# Patient Record
Sex: Female | Born: 1970 | Race: White | Hispanic: No | Marital: Married | Smoking: Never smoker
Health system: Southern US, Community
[De-identification: ages and names within clinical notes are randomized; demographics above are authoritative.]

## PROBLEM LIST (undated history)

## (undated) DIAGNOSIS — F32A Depression, unspecified: Secondary | ICD-10-CM

## (undated) DIAGNOSIS — M722 Plantar fascial fibromatosis: Secondary | ICD-10-CM

## (undated) DIAGNOSIS — Z86711 Personal history of pulmonary embolism: Secondary | ICD-10-CM

## (undated) DIAGNOSIS — G4730 Sleep apnea, unspecified: Secondary | ICD-10-CM

## (undated) DIAGNOSIS — I10 Essential (primary) hypertension: Secondary | ICD-10-CM

## (undated) HISTORY — DX: Personal history of pulmonary embolism: Z86.711

## (undated) HISTORY — DX: Essential (primary) hypertension: I10

## (undated) HISTORY — DX: Depression, unspecified: F32.A

## (undated) HISTORY — PX: CHOLECYSTECTOMY: SHX55

## (undated) HISTORY — DX: Plantar fascial fibromatosis: M72.2

---

## 1998-08-12 ENCOUNTER — Ambulatory Visit (HOSPITAL_COMMUNITY): Admission: RE | Admit: 1998-08-12 | Discharge: 1998-08-12 | Payer: Self-pay | Admitting: Internal Medicine

## 1998-08-12 ENCOUNTER — Encounter: Payer: Self-pay | Admitting: Internal Medicine

## 1998-08-13 ENCOUNTER — Ambulatory Visit (HOSPITAL_COMMUNITY): Admission: RE | Admit: 1998-08-13 | Discharge: 1998-08-13 | Payer: Self-pay | Admitting: Internal Medicine

## 1999-09-14 ENCOUNTER — Ambulatory Visit (HOSPITAL_COMMUNITY): Admission: RE | Admit: 1999-09-14 | Discharge: 1999-09-14 | Payer: Self-pay | Admitting: Obstetrics and Gynecology

## 1999-09-14 ENCOUNTER — Encounter: Payer: Self-pay | Admitting: Obstetrics and Gynecology

## 1999-11-22 ENCOUNTER — Ambulatory Visit (HOSPITAL_COMMUNITY): Admission: RE | Admit: 1999-11-22 | Discharge: 1999-11-22 | Payer: Self-pay | Admitting: Obstetrics and Gynecology

## 1999-12-30 ENCOUNTER — Inpatient Hospital Stay (HOSPITAL_COMMUNITY): Admission: AD | Admit: 1999-12-30 | Discharge: 1999-12-30 | Payer: Self-pay | Admitting: Obstetrics and Gynecology

## 2000-01-19 ENCOUNTER — Encounter (HOSPITAL_COMMUNITY): Admission: AD | Admit: 2000-01-19 | Discharge: 2000-02-01 | Payer: Self-pay | Admitting: Obstetrics and Gynecology

## 2000-01-31 ENCOUNTER — Inpatient Hospital Stay (HOSPITAL_COMMUNITY): Admission: AD | Admit: 2000-01-31 | Discharge: 2000-02-02 | Payer: Self-pay | Admitting: Obstetrics and Gynecology

## 2000-01-31 ENCOUNTER — Encounter (INDEPENDENT_AMBULATORY_CARE_PROVIDER_SITE_OTHER): Payer: Self-pay

## 2000-02-03 ENCOUNTER — Encounter: Admission: RE | Admit: 2000-02-03 | Discharge: 2000-04-12 | Payer: Self-pay | Admitting: Obstetrics and Gynecology

## 2000-03-20 ENCOUNTER — Other Ambulatory Visit: Admission: RE | Admit: 2000-03-20 | Discharge: 2000-03-20 | Payer: Self-pay | Admitting: Obstetrics and Gynecology

## 2000-11-23 ENCOUNTER — Ambulatory Visit (HOSPITAL_COMMUNITY): Admission: RE | Admit: 2000-11-23 | Discharge: 2000-11-23 | Payer: Self-pay | Admitting: Internal Medicine

## 2000-11-23 ENCOUNTER — Encounter: Payer: Self-pay | Admitting: Internal Medicine

## 2000-12-24 ENCOUNTER — Observation Stay (HOSPITAL_COMMUNITY): Admission: RE | Admit: 2000-12-24 | Discharge: 2000-12-25 | Payer: Self-pay | Admitting: General Surgery

## 2000-12-24 ENCOUNTER — Encounter: Payer: Self-pay | Admitting: General Surgery

## 2000-12-24 ENCOUNTER — Encounter (INDEPENDENT_AMBULATORY_CARE_PROVIDER_SITE_OTHER): Payer: Self-pay

## 2001-04-01 ENCOUNTER — Other Ambulatory Visit: Admission: RE | Admit: 2001-04-01 | Discharge: 2001-04-01 | Payer: Self-pay | Admitting: Obstetrics and Gynecology

## 2005-06-16 ENCOUNTER — Inpatient Hospital Stay (HOSPITAL_COMMUNITY): Admission: AD | Admit: 2005-06-16 | Discharge: 2005-06-16 | Payer: Self-pay | Admitting: Obstetrics and Gynecology

## 2005-08-18 ENCOUNTER — Inpatient Hospital Stay (HOSPITAL_COMMUNITY): Admission: AD | Admit: 2005-08-18 | Discharge: 2005-08-22 | Payer: Self-pay | Admitting: Obstetrics and Gynecology

## 2005-08-18 ENCOUNTER — Encounter (INDEPENDENT_AMBULATORY_CARE_PROVIDER_SITE_OTHER): Payer: Self-pay | Admitting: Specialist

## 2005-08-21 ENCOUNTER — Encounter: Payer: Self-pay | Admitting: Urology

## 2005-08-25 ENCOUNTER — Ambulatory Visit (HOSPITAL_COMMUNITY): Admission: RE | Admit: 2005-08-25 | Discharge: 2005-08-25 | Payer: Self-pay | Admitting: Urology

## 2005-08-25 ENCOUNTER — Ambulatory Visit (HOSPITAL_COMMUNITY): Admission: RE | Admit: 2005-08-25 | Discharge: 2005-08-25 | Payer: Self-pay | Admitting: Diagnostic Radiology

## 2005-09-04 ENCOUNTER — Ambulatory Visit (HOSPITAL_COMMUNITY): Admission: RE | Admit: 2005-09-04 | Discharge: 2005-09-04 | Payer: Self-pay | Admitting: Urology

## 2005-09-06 ENCOUNTER — Ambulatory Visit (HOSPITAL_COMMUNITY): Admission: RE | Admit: 2005-09-06 | Discharge: 2005-09-06 | Payer: Self-pay | Admitting: Urology

## 2007-10-07 ENCOUNTER — Other Ambulatory Visit: Admission: RE | Admit: 2007-10-07 | Discharge: 2007-10-07 | Payer: Self-pay | Admitting: Obstetrics and Gynecology

## 2008-05-26 ENCOUNTER — Ambulatory Visit: Payer: Self-pay | Admitting: Cardiology

## 2008-05-27 ENCOUNTER — Inpatient Hospital Stay (HOSPITAL_COMMUNITY): Admission: EM | Admit: 2008-05-27 | Discharge: 2008-06-01 | Payer: Self-pay | Admitting: Emergency Medicine

## 2008-05-27 ENCOUNTER — Ambulatory Visit: Payer: Self-pay | Admitting: Vascular Surgery

## 2008-05-27 ENCOUNTER — Encounter (INDEPENDENT_AMBULATORY_CARE_PROVIDER_SITE_OTHER): Payer: Self-pay | Admitting: Pulmonary Disease

## 2008-10-13 ENCOUNTER — Other Ambulatory Visit: Admission: RE | Admit: 2008-10-13 | Discharge: 2008-10-13 | Payer: Self-pay | Admitting: Obstetrics and Gynecology

## 2009-10-14 ENCOUNTER — Other Ambulatory Visit: Admission: RE | Admit: 2009-10-14 | Discharge: 2009-10-14 | Payer: Self-pay | Admitting: Obstetrics and Gynecology

## 2010-02-11 IMAGING — CR DG CHEST 2V
2 series · 2 of 2 positions shown · non-contrast
Comparison: None available

CLINICAL DATA: Shortness of breath.

CHEST - 2 VIEW

[w chest pa]
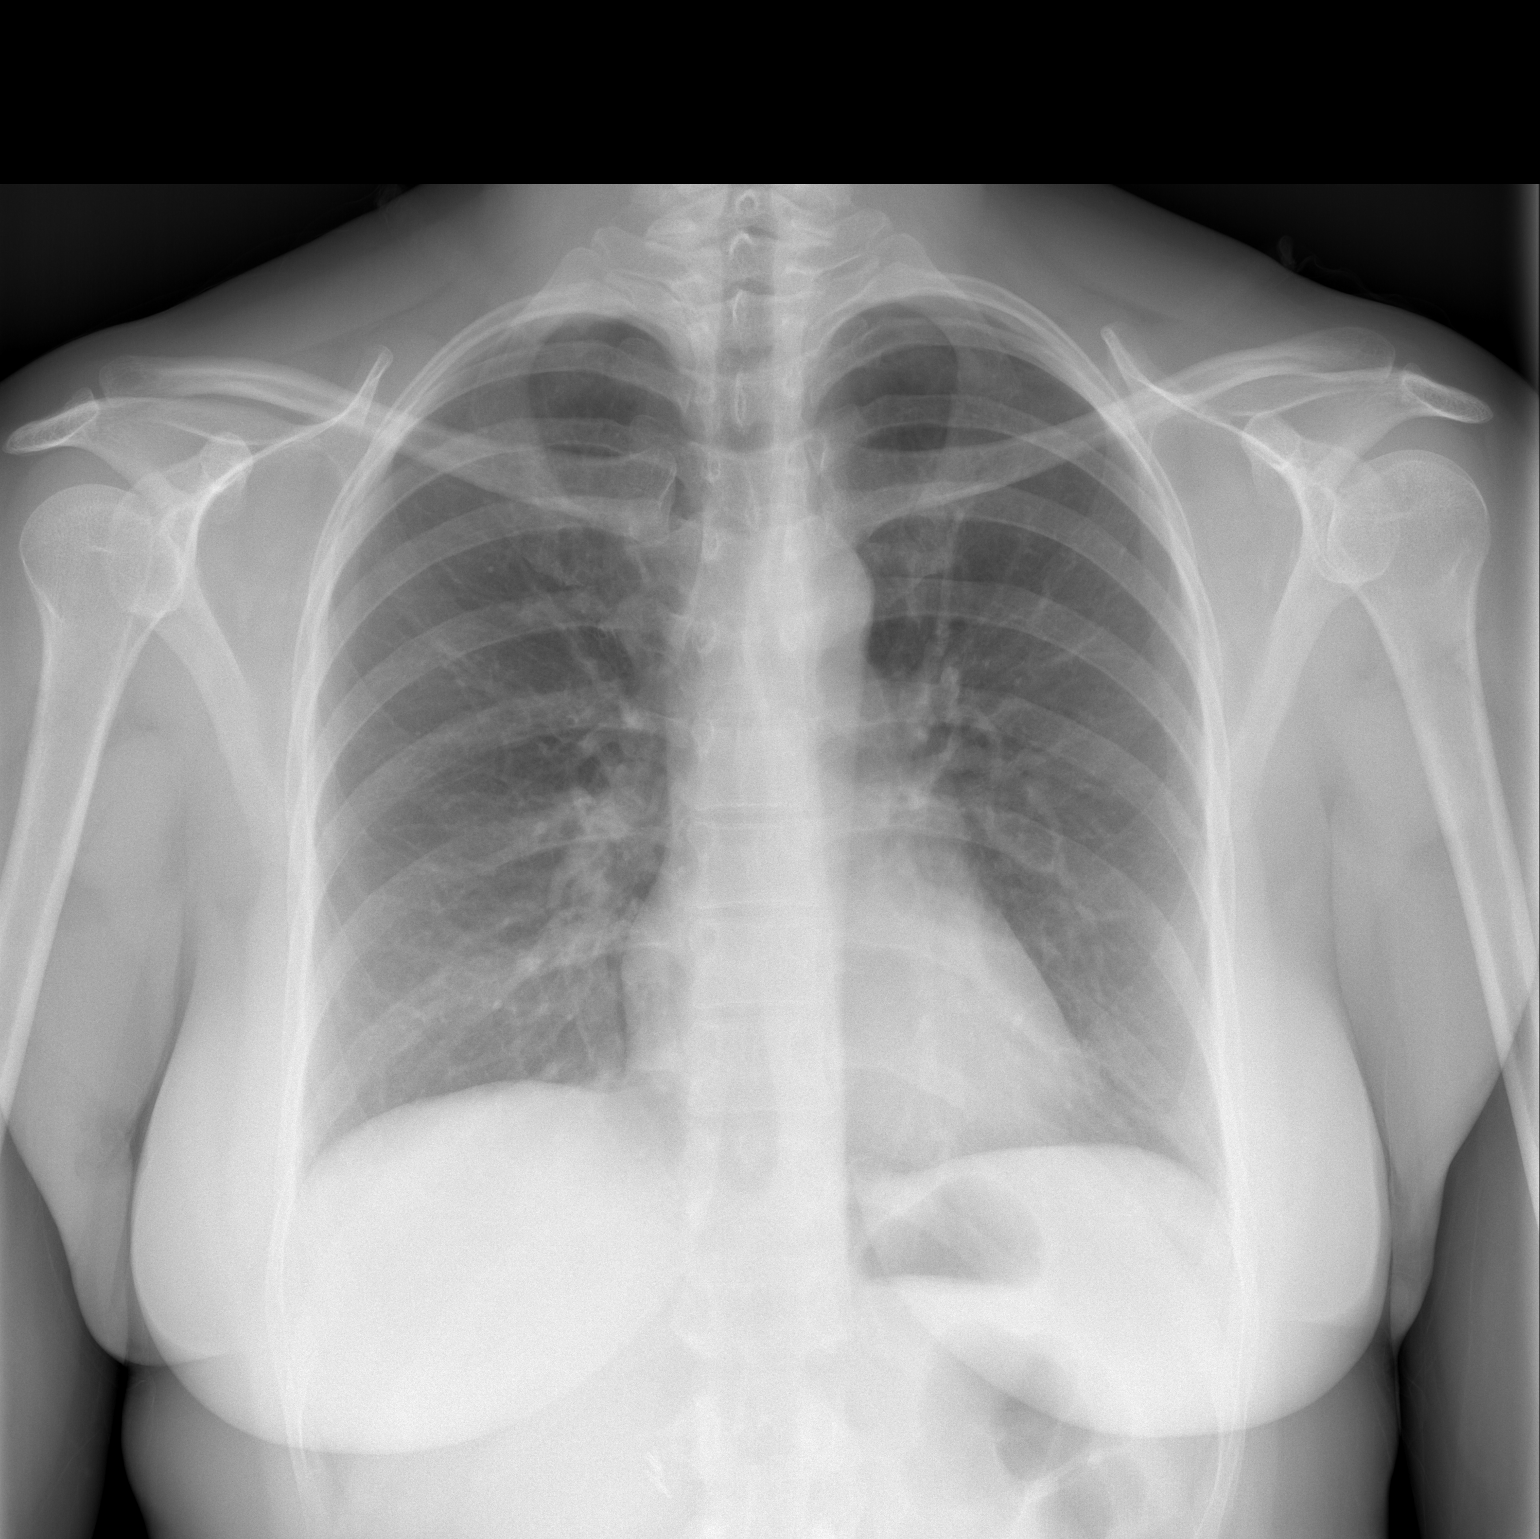

[w chest lat]
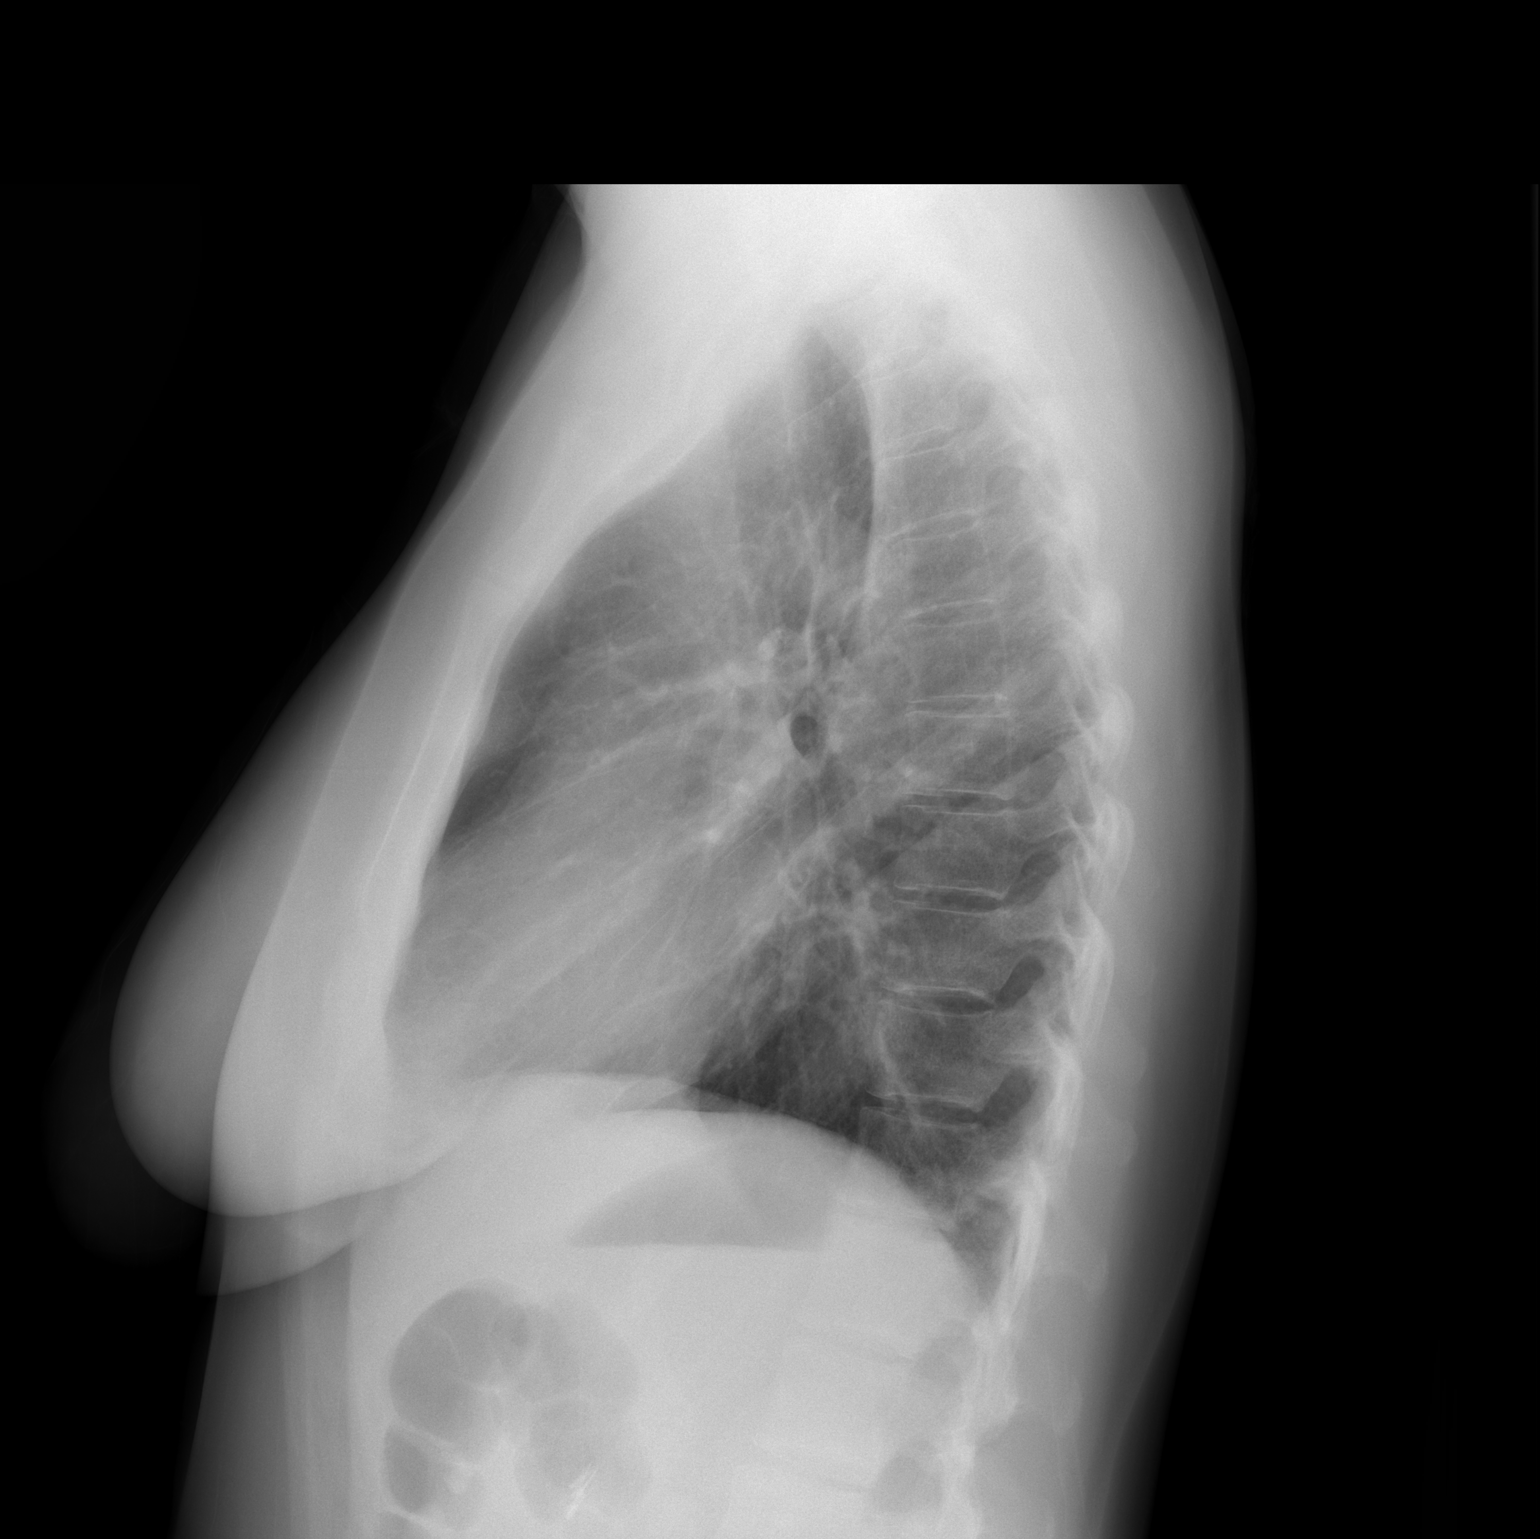

[2 of 2 positions shown; findings below may reference images not displayed]

FINDINGS: The lungs are clear without focal consolidation, edema,
effusion or pneumothorax.  Cardiopericardial silhouette is within
normal limits for size.  Imaged bony structures of the thorax are
intact.
IMPRESSION: No acute cardiopulmonary process.

## 2010-11-05 ENCOUNTER — Encounter: Payer: Self-pay | Admitting: Urology

## 2011-02-28 NOTE — H&P (Signed)
NAME:  [name], [name] NO.:  [id number]   MEDICAL RECORD NO.:  [id number]          PATIENT TYPE:  INP   LOCATION:  0101                         FACILITY:  [facility]   PHYSICIAN:  Vanetta Mulders, MD         DATE OF BIRTH:  April 21, 1971   DATE OF ADMISSION:  05/27/2008  DATE OF DISCHARGE:                              HISTORY & PHYSICAL   PRIMARY CARE PHYSICIAN:  Dr. Reuel Boom with [facility] in  [place], [place].   The patient is going to be admitted to [facility], team H.   CHIEF COMPLAINT:  Right lower lobe segmental PE.   PRESENT ILLNESS:  Ms. [name] is a 40 year old woman with an  insignificant past medical history, who presented to the emergency room  for right lower pleuritic chest pain associated with shortness of  breath, tachycardia, right shoulder pain, right arm numbness and right  neck pain.  She started having these symptoms on the day prior to  admission around 2 p.m.  At that time, she noted slight discomfort in  her ribcage on the right lower side that got worse through the day to  the point where she could not sit still, increased from a level 2 to a  level 9, and made her come to the emergency room.  On her way to the  emergency room, she started having shortness of breath, heart racing,  right shoulder pain, right arm numbness and right neck pain.  The  patient denies having any blood clotting disorders.  She has never had a  blood clot.  She denies history of miscarriages.  She has three healthy  daughters.  Recently, she had traveled to [place] by car, she drove  for 5 hours back and forth, that was 3-4 days ago.  Three weeks ago, she  traveled to [place].  They drove there.  At that time though she stopped  frequently.   PAST MEDICAL HISTORY:  1. History of C-section.  2. History of cholecystectomy in 2002.   FAMILY HISTORY:  The patient's mother has hypertension and thyroid  problems.  The patient's father has history of tonsil  cancer, but both  of them are alive and healthy at this time.   SOCIAL HISTORY:  The patient lives with her husband.  She has three  children, all healthy.  Her younger daughter is 38-1/2 years old.  The  patient denies smoking, drinking or taking any drugs.   DRUG ALLERGIES:  NO KNOWN DRUG ALLERGY.   CURRENT MEDICATIONS:  1. Lexapro.  2. Yaz.  3. Trazodone.   She states that several months ago, she was on herbal supplements for a  certain diet that she was doing.  The nutritionist that she was seeing  at that time gave her subcutaneous injections in the abdominal area.  She does not know what exactly those injections were containing.   REVIEW OF SYSTEMS:  Negative for pertinent other than history and  physical.  In particular, the patient denies any problems with  urination, burning micturition or frequent urination.   PHYSICAL EXAMINATION:  VITAL SIGNS:  Shows a temperature of 97, heart  rate 98-102, respiratory rate 18-20, blood pressure 108/72-117/79.  Oxygen saturation 97-100.  GENERAL:  The patient is in no acute distress.  HEENT:  Eyes PERRLA.  Extraocular movements intact.  Normal lucency of  tympanic membranes.  Clear ear canals.  Nose, moist mucosa.  NECK:  Supple.  CHEST:  Good air movement.  Clear to auscultation bilaterally.  No  wheezing, no crackles, no dullness to percussion.  ABDOMEN:  Bowel sounds positive.  Soft, nontender, nondistended.  EXTREMITIES:  No edema, good pulses.  Homan's negative.  HEART:  Tachycardia, regular.  NEUROLOGIC:  Nonfocal.  PSYCHOLOGIC:  Alert and oriented.  SKIN:  No rash.   LABORATORY DATA:  PTT 28, PT 12.1, INR 0.9, CK-MB less than 1, troponin  0.05.  Myoglobin 45.5, D dimer 0.75.  Urine micro showing 7-10 red blood  cells.  Urine pregnancy test negative.  CT pelvis - tiny bilateral  nonobstructive calculi, no pyelo, no hydronephrosis.  CT angio of the  chest showed acute pulmonary embolus segmental and subsegmental right   lower lobe.   IMPRESSION:  Right lower lobe segment and subsegmental pulmonary  embolism.  Electrocardiogram showed a small S1 and a small Q3.  An  incomplete right bundle branch block, which is consistent with some  right ventricular strain, most likely from the pulmonary embolism.  At  this time, we will admit the patient to stepdown.  The patient was  started on heparin IV before I saw her in the emergency department.  Will continue this with caveat that  she will need to be switched from  heparin to Lovenox for home anticoagulation.  We will start Coumadin for  pulmonary embolism treatment.  We will check regular PT/INRs.  The  patient received oxygen, and she will continue oxygen therapy at 2  liters per minute.  She is going to be on bed rest for now.  We are  going to check lower extremity Dopplers and a 2-D echo, as well as a  brain natriuretic peptide and continue cycling enzymes for pulmonary  embolism prognosis and restratification.      Vanetta Mulders, MD  Electronically Signed     DA/MEDQ  D:  05/27/2008  T:  05/27/2008  Job:  [id number]

## 2011-02-28 NOTE — Discharge Summary (Signed)
NAMEMarland Kitchen  [name], [name] NO.:  [id number]   MEDICAL RECORD NO.:  [id number]          PATIENT TYPE:  INP   LOCATION:  1424                         FACILITY:  [facility]   PHYSICIAN:  Isidor Holts, M.D.  DATE OF BIRTH:  08-Sep-1971   DATE OF ADMISSION:  05/26/2008  DATE OF DISCHARGE:  06/01/2008                               DISCHARGE SUMMARY   PMD:  Dr. Donzetta Sprung, [facility] in [place], [place]   DISCHARGE DIAGNOSES:  1. Acute pulmonary embolism.  2. History of oral contraceptive use.  3. Transaminitis.  4. History of previous cholecystectomy.  5. History of previous cesarean section.   DISCHARGE MEDICATIONS:  1. Lovenox 120 mg subcutaneously daily until INR therapeutic at 2 to 3      for 48 hours, then stop.  2. Coumadin q. 6 p.m. daily per INR.  3. Darvocet-N 100, one p.o. p.r.n. q.6 hourly.  4. Lexapro 10 mg p.o. daily.  5. Trazodone 50 mg p.o. p.r.n. nightly.   Note:  Dianah Field has been discontinued.   This medication list may, of course, be updated/modified at the time of  actual discharge, by discharging M.D.   PROCEDURES:  1. Chest x-ray May 26, 2008.  This showed no acute cardiopulmonary      process.  2. Abdominal/pelvic CT scan dated May 26, 2008.  This showed trace      atelectasis or infection in the posterior right lower lobe deep in      the costophrenic sulcus, tiny bilateral nonobstructive renal      calculi with no secondary changes in either kidney.  CT pelvis      showed no distal urethral or bladder calculi.  3. Chest CT angiogram dated May 27, 2008.  This showed acute      pulmonary embolism to segmental and subsegmental pulmonary arteries      to the right lobe.  4. Bilateral lower extremity venous Doppler dated May 27, 2008.      This showed no evidence of DVT or superficial thrombosis.   CONSULTATIONS:  None.   ADMISSION HISTORY:  As in H&P notes of May 27, 2008 dictated by Dr.  Vanetta Mulders.   However, in brief, this is a 40 year old female, with known  history of previous cesarean section status post cholecystectomy 2002,  currently on oral contraceptive medication, who presented with a 1-day  history of right lower pleuritic-type chest pain, associated with  shortness of breath and tachycardia.  Reportedly, 3 weeks ago, she had  traveled by car to [place] and 3 to 4 days prior to presentation, she  had traveled to [place] by car, ie 5 hour trip to and fro.  She was  admitted for further evaluation, investigation and management.   CLINICAL COURSE:  1. Pulmonary embolism.  For details of presentation, refer to      admission history above.  The patient underwent chest CT angiogram      which revealed acute pulmonary embolism.  For details of findings,      refer to procedure list above.  The patient was managed  with      anticoagulation initially with intravenous Heparin, and      subsequently transitioned to subcutaneous Lovenox and concomitant      Coumadin treatment.  She did have severe right-sided pleuritic type      chest pain, likely against background of possible pulmonary      infarct.  She was therefore, managed with analgesic medication, ie      a brief course of NSAIDS as well as opioid analgesics, with      satisfactory clinical response.  By May 30, 2008, chest pain was      considerably ameliorated.  The patient was no longer short of      breath at rest.  She was able to ambulate and we were able to      transfer her from the step-down unit to the telemetry floor.  By      May 31, 2008, the patient was completely asymptomatic.  Clearly,      predispositions for the patient's pulmonary embolism was her oral      contraceptive use which has been discontinued, as well as recent      long distance travel by road.  As these conditions are reversible      and easily identifiable, the patient is recommended a maximum of 6      months of anticoagulation  therapy.  Clearly she will need to      utilize some other form of contraception, i.e. non-hormonal.   1. Transaminitis.  LFTs at the time of initial presentation showed an      alkaline phosphatase of 56, AST 24, ALT 22.  However, on May 28, 2008, LFTs had become altered with alkaline phosphatase of 94, AST      125, ALT 161, raising the possibility of acute hepatitis versus      hepatic irritation from right supradiaphragmatic process.      Hepatitis A, B and C screens were done.  These were negative.  By      May 30, 2008, LFTs had practically normalized and alkaline      phosphatase was 79, AST 90, ALT 61.  The patient has been reassured      accordingly.   DISPOSITION:  The patient was on May 31, 2008, considered  sufficiently clinically recovered for discharge to be considered on  June 01, 2008, provided no acute problems arise in the interim.  She  has been recommended to return to regular duties after 2 weeks, or  otherwise as indicated by her primary M.D.   DIET:  No restrictions.   ACTIVITY:  As tolerated.   FOLLOWUP INSTRUCTIONS:  The patient is to follow up with her primary  M.D., Dr. Donzetta Sprung at [facility] in [place], [place] within 1 week of discharge.   SPECIAL INSTRUCTIONS:  The patient is recommended to have PT/INR checked  in a.m. of June 03, 2008 at PMD's office.  PMD will subsequently  contact the patient with recommendations as to Coumadin dosage.  The  patient's PMD is expected to supervise subsequent anticoagulation.  Fortunately, the patient's spouse is an [employer] fundraiser. and can administer Lovenox  until recommended otherwise, by the patient's primary M.D.  All this has  been communicated to the patient and and spouse, and they have  verbalized understanding.      Isidor Holts, M.D.  Electronically Signed    CO/MEDQ  D:  05/31/2008  T:  05/31/2008  Job:  [id number]   cc:   Donzetta Sprung  Fax: [phone]

## 2011-03-03 NOTE — Discharge Summary (Signed)
[name], [name]                ACCOUNT NO.:  [id number]   MEDICAL RECORD NO.:  [id number]          PATIENT TYPE:  INP   LOCATION:  9109                          FACILITY:  [facility]   PHYSICIAN:  Malachi Pro. Ambrose Mantle, M.D. DATE OF BIRTH:  1970-11-08   DATE OF ADMISSION:  08/18/2005  DATE OF DISCHARGE:  08/22/2005                                 DISCHARGE SUMMARY   This is a 40 year old white female para 2-0-0-2, gravida 3, EDC September 04, 2005 who was scheduled for a cesarean section because she did not want to  undergo the trauma of vaginal delivery again.  She came to the hospital at 6  cm dilatation and declined to proceed with vaginal delivery.  I was called  and the patient was prepared for cesarean section and when I arrived in the  operating room the patient already had her spinal and was on the operating  table with the abdomen prepped.  I was told after the surgery that the  patient was pushing in the operating room.  She underwent a low transverse  cervical cesarean section and because the baby's head was so low I actually  entered the uterus around the area of the scapula.  I was able to lift the  baby out of the deep position, through the incisional opening.  It was a 6  pound 12 ounce female with Apgars of 9 at one and 9 at five minutes.  I  identified the extent of the uterine incision, but realized I had extremely  severe hemorrhage lateral to the left uterine angle.  I packed this with  several packs, went ahead and closed the uterine incision, and waited for  Dr. Jackelyn Knife to arrive before trying to control the bleeding lateral to the  left uterine incision.  After his arrival I proceeded to obtain hemostasis  by ligating several blood vessels and the suturing was done well inferior  and lateral to the left uterine angle.  Because of the location of some of  the suturing, I wanted to do a CT scan on the first postoperative day.  The  patient did not have any symptoms of  pain but the CT scan did show a high-  grade left ureteral obstruction.  The earliest the interventional radiology  department could schedule the percutaneous nephrostomy was on August 21, 2005.  The patient did undergo a successful left nephrostomy by Dr. Chandra Batch  on August 21, 2005 but there was complete obstruction of the distal ureter.  He could not pass a wire into the bladder.  Dr. Isabel Caprice had seen the patient  in consultation on August 19, 2005 and had requested the interventional  radiology department to proceed with nephrostomy when they could.  From the  patient's cesarean section standpoint she progressed well with passing  flatus, tolerating a regular diet, ambulating well, voiding well, and on the  fourth postoperative day was ready for discharge.  Dr. Chandra Batch had suggested  that I keep her in the hospital overnight to ensure that she had no  significant problems.  She did have  hematuria through the nephrostomy but  this did not involve clots and did not involve clogging of the catheter.  Dr. Isabel Caprice evaluated the patient after the nephrostomy and he agreed with  Dr. Chandra Batch to repeat the nephrostogram on Friday and if they were unable to  pass it the next attempt would be in approximately two weeks.  Laboratory  data showed that the patient did get RhoGAM on August 19, 2005.  The  patient's laboratory work for some reason is not in her chart but she did  start with a normal hemoglobin and postoperatively her blood count went as  low as hemoglobin of 8.7, hematocrit 25.6, white count 13,400.  A CT scan on  August 19, 2005 showed a high-grade left urinary tract obstruction  worrisome for ureteral injury.  There were bilateral pleural effusions and  bibasilar atelectasis.  Patient had a small hiatal hernia.  Liver, adrenal  gland, spleen, and pancreas all appear normal.  Patient was status post  cholecystectomy.  There was bilateral hydronephrosis, tiny, non-obstructing   calculi was seen in the upper pole of the kidneys bilaterally.  There was  marked delay of excretion of the contrast material on the left.  Findings  were compatible with left ureteral high-grade obstruction.  The bowel  appeared normal.  The patient prefers not to have her staples removed on the  day of discharge because she might have to lie on her abdomen and she will  come back to the office in one week to have those removed.  She is to return  for a nephrostogram on August 25, 2005.  She understands how that will be  done.  She is given a prescription for Percocet 5/325, 30 tablets one or two  every four to six hours as needed for pain and Darvocet-N 100 30 tablets one  every four to six hours as needed for pain, but do not use them both at the  same time.  She is also given a prescription for Keflex 500 mg 28 tablets  one every six hours as needed for pain.  I will check this dosage with Dr.  Isabel Caprice and see if he agrees.      Malachi Pro. Ambrose Mantle, M.D.  Electronically Signed     TFH/MEDQ  D:  08/22/2005  T:  08/22/2005  Job:  [id number]   cc:   Dwyane Luo. Fischer, M.D.  Fax: [phone]   Valetta Fuller, M.D.  Fax: [phone]

## 2011-03-03 NOTE — Discharge Summary (Signed)
[name], KOPEC                ACCOUNT NO.:  [id number]   MEDICAL RECORD NO.:  [id number]          PATIENT TYPE:  INP   LOCATION:  9109                          FACILITY:  [facility]   PHYSICIAN:  Malachi Pro. Ambrose Mantle, M.D. DATE OF BIRTH:  December 11, 1970   DATE OF ADMISSION:  08/18/2005  DATE OF DISCHARGE:  08/22/2005                                 DISCHARGE SUMMARY   ADDENDUM:   FINAL DIAGNOSES:  1.  Intrauterine pregnancy at 37+ weeks delivered by cesarean section.      Declined vaginal birth.  2.  Pelvic hemorrhage secondary to laceration of pelvic blood vessels.  3.  Left ureteral obstruction secondary probably to suture of the left      ureter close to the bladder.   OPERATION:  1.  Low transverse cervical cesarean section.  2.  Control of hemorrhage from left pelvic vessels.  3.  Anemia secondary to blood loss.  4.  Left nephrostomy with attempt at passage of a wire, but unable to pass      the wire through the ureteral obstruction.   CONDITION:  Improved.  Instructions given.  Return in one week, but have her  nephrostogram in four days.      Malachi Pro. Ambrose Mantle, M.D.  Electronically Signed     TFH/MEDQ  D:  08/22/2005  T:  08/22/2005  Job:  [id number]   cc:   Valetta Fuller, M.D.  Fax: [phone]   Dwyane Luo. Fischer, M.D.  Fax: [phone]

## 2011-03-03 NOTE — Op Note (Signed)
[name], [name]                ACCOUNT NO.:  [id number]   MEDICAL RECORD NO.:  [id number]          PATIENT TYPE:  INP   LOCATION:  9199                          FACILITY:  [facility]   PHYSICIAN:  Malachi Pro. Ambrose Mantle, M.D. DATE OF BIRTH:  11-17-1970   DATE OF PROCEDURE:  08/18/2005  DATE OF DISCHARGE:                                 OPERATIVE REPORT   DATE OF SURGERY:  August 18, 2005.   PREOPERATIVE DIAGNOSIS:  Intrauterine pregnancy at 37+ weeks, active labor,  declines vaginal birth.   POSTOPERATIVE DIAGNOSIS:  1.  Intrauterine pregnancy at 37+ weeks, active labor, declines vaginal      birth.  2.  Hemorrhage secondary to laceration of paracervical and paravaginal      vessels.   PROCEDURE:  Low transverse cervical Cesarean section.  Suture of pelvic  vessel.   SURGEON:  Malachi Pro. Ambrose Mantle, M.D.   ASSISTANT:  Zenaida Niece, M.D.   ANESTHESIA:  Spinal anesthesia.   DESCRIPTION OF PROCEDURE:  The patient was already on the operating  table  with a spinal anesthesia.  The abdomen had been prepped with Betadine  solution, a Foley catheter was indwelling.  Anesthesia was confirmed after  the area was draped as a sterile field and a transverse incision was made  and carried in layers through the skin and subcutaneous tissue and fascia.  The fascia was separated from the rectus muscles superiorly and inferiorly  an the rectus muscle was split in the midline.  The peritoneum was opened  vertically.  The lower uterine segment was exposed.  An incision was made  into the myometrium.  I went the rest of the way with my finger into the  amniotic cavity and got clear fluid.  The incision was enlarged by pulling  superiorly and inferiorly and I came down on what I thought might be the  breech but it was actually the baby's back and the shoulders were down below  the incision and the head was well into the pelvis.  I was able to lift the  baby's head out of the pelvis, delivered the  head, suctioned the nose and  mouth, delivered the rest of the body, clamped the cord and gave the infant  to Dr. Venetia Constable who was in attendance and she gave the 6 pounds, 12 ounces  female infant Apgar's of 9 at 1 minute and 9 at 5 minutes.  The placenta was  not completely intact but I was able to remove the placenta after obtaining  routine cord blood studies and a loop of cord in case a pH was necessary.  After I removed the placenta, I was able to visualize an extension of the  left uterine angle and even after obtaining the limits of the uterine  incision extension, there was still active bleeding lateral and inferior to  this.  I realized that I needed assistance so I called for Dr. Jackelyn Knife and  he came as quickly as he could.  I controlled the bleeding lateral to the  incision with packs and holding the packs in place with  retractors and then  closed the uterine incision with two running sutures of 0 Vicryl, locking  the first layer, non-locking on the second layer.  This got hemostasis on  the uterine incision well controlled.  After Dr. Jackelyn Knife arrived, with the  aid of wide retractors and multiple packs, I was able to visualize at least  three or four lacerated vessels, large in caliber, that I was able to suture  ligate.  This seemed to control the bleeding, although there was some  bleeding from soft tissue that I sutured.  This was quite deep in the  pelvis, probably at least 3 inches below the uterine angle extension.  Hemostasis at this point appeared adequate.  I did have a hole in the left  broad ligament that I sutured with 0 Vicryl.  Before Dr. Jackelyn Knife arrived,  I sutured the left uterine vessels to try to see if I could reduce her pulse  pressure.  All packs were removed.  There was no bleeding and I closed the  abdominal wall, after liberal irrigation, with 0 Vicryl on the rectus muscle  and peritoneum, two running sutures of 0 Vicryl on the fascia, running 3-0   Vicryl on the subcutaneous tissue and staples on the skin.  I did a vaginal  exam at the end of the procedure and I did not feel any products of  conception in the uterus.  The patient seemed to tolerate the procedure  well.  The anesthetist estimated blood loss at 1700 cc but he was not sure  how much of that was fluid and how much was blood.  The patient's vital  signs remained stable during the procedure and she was returned to the  recovery room in satisfactory condition. The uterus, tubes, and ovaries  looked normal.      Malachi Pro. Ambrose Mantle, M.D.  Electronically Signed     TFH/MEDQ  D:  08/18/2005  T:  08/18/2005  Job:  [id number]

## 2011-03-03 NOTE — Op Note (Signed)
[facility]  Patient:    [name], [name]                       MRN: [id number] Proc. Date: 12/24/00 Adm. Date:  [id number] Attending:  Carson Myrtle                           Operative Report  PREOPERATIVE DIAGNOSIS:  Chronic cholecystolithiasis.  POSTOPERATIVE DIAGNOSIS:  Chronic cholecystolithiasis.  PROCEDURES PERFORMED:  Laparoscopic cholecystectomy and operative cholangiogram.  SURGEON:    Timothy E. Earlene Plater, M.D.  ASSISTANT:  Sunday Shams, P.A.-C.  ANESTHESIA:  C.R.N.A. supervised M.D. general endotracheal anesthesia.  INDICATIONS:  [name] is 42 and is otherwise healthy. She has had symptoms approximately three years including pancreatitis. The patient continues to be food intolerant, acute and chronic right upper quadrant pain, some weight loss some nausea and vomiting. Ultrasound reveals gallstones and she wishes to proceed with surgery at this point as has been carefully discussed.  DESCRIPTION OF PROCEDURE:  The patient was brought to the operating room and placed supine, and general endotracheal anesthesia administered. The abdomen was scrubbed, prepped and draped in the usual fashion. Marcaine 0.50% with epinephrine was used at each puncture site prior to incision.  An infraumbilical skin incision was made, the fascia identified, opened vertically and the peritoneum was entered without complication. The Hasson catheter was tied in place and the abdomen insufflated. The peritoneoscopy was unremarkable. A second 10 mm trocar introduced in the midepigastrium and two 5 mm trocars in the right upper quadrant. Appropriate instruments were applied. The gallbladder was grasped and placed on tension. Its anatomy seemed normal. An anterior artery was easily dissected, triply clipped and divided. The cystic duct was then dissected out. A clip was placed at the gallbladder junction with the cystic duct. A small incision was made and the  cystic duct and the Reddick catheter introduced. The bulb inflated and was thought to be in the common duct. Real-time cholangiography accomplished showing a rapid, smooth flow of dye from the point of injection down the common bile duct into the duodenum. The bulb prevented flow into the hepatic tree. We manipulated the catheter, released the balloon or bulb and some dye went into the hepatic duct but not completely. We then tried to manipulate the catheter and not inflate the bulb, but were unable to do so. Since this part was so clearly normal, I did not pursue it further. The stump of the cystic duct was triply clamped, the cystic duct divided and the gallbladder removed from the gallbladder bed without incident or complication. Irrigation showed it to be clear without bleeding or bile. The gallbladder was removed through the infraumbilical skin incision which was then tied closed. Irrigation was carried out and was clear. All irrigation, CO2, instruments and trocars were removed. The counts were correct. The skin incision was closed with 3-0 Monocryl. A dry, sterile dressing was applied over Steri-Strips. The second counts were correct. She tolerated it well and was removed to the recovery room in good condition. DD:  12/24/00 TD:  12/24/00 Job: [id number]

## 2011-03-03 NOTE — Discharge Summary (Signed)
[facility]  Patient:    [name], [name]                       MRN: [id number] Adm. Date:  [id number] Disc. Date: [id number] Attending:  Malon Kindle                           Discharge Summary  HISTORY OF PRESENT ILLNESS:   This is a 40 year old white married female, para 1-0-0-1, gravida 2, last period May 08, 1999, [facility] February 13, 2000 by dates and Feb 14, 2000 by ultrasound who was admitted in active labor with elevated blood pressure.  Blood group and type was AB negative, negative antibody, nonreactive serology, Rubella positive, Hepatitis B surface antigen negative, HIV declined, GC and chlamydia negative, triple screen normal, Group B Strep negative, one hour Glucola normal by the patients report.  Vaginal ultrasound on July 12, 1999, crown rump length 2.2 cm, nine weeks zero days, [facility] Feb 14, 2000.  Repeat ultrasound September 14, 1999, average gestational age [redacted] weeks three days, [facility] February 12, 2000.  The patients blood pressure became elevated on December 19, 1999.  A 24 hour urine protein was 232.7 mg.  Creatinine clearance was 95 ml a minute.  Liver function tests and platelets were normal. Blood pressure remained borderline elevated at home and 145/100 in our office. At 12 midnight on the day of admission the patient began contracting.  She came to maternity admissions and had spontaneous rupture of membranes in the unit with cervix 6 cm per the nurse.  No headaches or epigastric pain.  PAST MEDICAL HISTORY:         Acute gastritis in 2000, PIH with first pregnancy.  ALLERGIES:                    No known allergies.  OPERATIONS:                   None.  FAMILY HISTORY:               Mother with thyroid problems.  Father of the baby, Henoch-Schonleins purpura.  OBSTETRIC HISTORY:            A 6 pound 13 ounce female, vaginal birth with PIH.  PHYSICAL EXAMINATION:         On admission the patients vital signs were temperature  99.3, pulse 92, respirations 22, blood pressure is between 142/90 and 172/113.  The abdomen was soft, fundal height 37 cm on January 26, 2000. Fetal heart tones were reactive.  Cervix by my exam was 5 cm, 90% vertex, 0 station.  DTRs 3+.  ADMITTING IMPRESSION:         Intrauterine pregnancy, 38 weeks, active labor, PIH.  HOSPITAL COURSE:              Patient was placed on magnesium sulfate and received an epidural.  Her liver function tests were borderline elevated.  She became fully dilated at 4:30 a.m.  She pushed for two hours and 15 minutes and began to tire and complain of right upper abdominal pain.  We proceeded with a forceps delivery with the vertex in the LOA position with the silver dollar of caput showing with pushing.  The patient then delivered LOA over a midline episiotomy by low forceps by Dr. Ambrose Mantle, a living female infant, 7 pounds 6 ounces, Apgars of 9 at one  and 10 at five minutes.  Placenta was intact, uterus normal, rectal negative, midline episiotomy repair with 3-0 Dexon. Blood loss 400 cc.  Postpartum the patient did quite well, but magnesium sulfate was continued for 24 hours and she was discharged on the second postpartum day.  She did received RhoGAM on February 01, 2000.  Her fetal screen was negative.  The baby was B positive.  Patients admitting hemoglobin was 12.2, hematocrit 35.7, white count 17,900, platelet count 234,000.  RPR was nonreactive.  Comprehensive metabolic profile showed an SGOT of 40, SGPT of 33, bilirubin of 0.1, creatinine 0.7, BUN 7.  On magnesium sulfate the patients magnesium level was 4.2.  Follow-up hemoglobin 11.1, hematocrit 32.1, platelet count 184,000, white count 12,100.  Urinalysis was negative for protein.  As stated the SGOT was 48, SGPT was 40 on admission.  On 12 noon on the day of delivery the patients SGOT was 40, SGPT was 33.  At the same time the LDH was 219, uric acid was 5.4.  FINAL DIAGNOSES: 1. Intrauterine pregnancy  at 38 weeks. 2. Preeclampsia. 3. Uterine inertia. 4. Prolonged second stage of labor.  OPERATION:  Low forceps delivery LOA, midline episiotomy and repair.  CONDITION:  Final condition improved.  INSTRUCTIONS:  Regular discharge instruction booklet.  She is to return in six weeks for follow-up examination.  Her last six blood pressures have been 128/80, 130/79, 120/70, 130/80, 120/80, and 100/70. DD:  02/02/00 TD:  02/02/00 Job: [id number]

## 2011-03-03 NOTE — H&P (Signed)
[name], [name]                ACCOUNT NO.:  [id number]   MEDICAL RECORD NO.:  [id number]          PATIENT TYPE:  INP   LOCATION:  9199                          FACILITY:  [facility]   PHYSICIAN:  [name], M.D. DATE OF BIRTH:  04/19/1971   DATE OF ADMISSION:  08/18/2005  DATE OF DISCHARGE:                                HISTORY & PHYSICAL   HISTORY OF PRESENT ILLNESS:  The patient is a 40 year old white female para  2-0-0-2, gravida 3, with [facility] September 04, 2005 who is scheduled for Cesarean  section on August 28, 2005 because she, by her account, had traumatic  vaginal deliveries and it took her a long time to recover and she declined a  vaginal birth.  The patient states that she began leaking fluid during the  night and came to the admission unit 6 cm dilated in active labor but  declined to proceed with vaginal delivery.  I was called and as quickly as  possible I came to the hospital and when I arrived in the hospital the  patient already had a spinal, ready for Cesarean section and according to  the nurses, after the end of the Cesarean section she was pushing in the  operating room.  Blood group and type A-B negative, negative antibody,  nonreactive serology, Rubella immune, hepatitis B surface antigen negative,  HIV declined.  Gonorrhea and Chlamydia negative.  Group B Strep positive.  Triple screen normal.  Vaginal ultrasound on January 24, 2005 crown-rump  length 1.75 cm, 8 weeks, 1 day, [facility] September 04, 2005.  The patient did have  positive Group B Strep in the urine on her prenatal labs and was treated  with Pen-Vee K.  A repeat ultrasound on April 03, 2005 showed an average  gestational age of [redacted] weeks, 4 days with [facility] of August 31, 2005.  On  May 22, 2005 the patient stated that she might want a Cesarean section.  She stated that her prior deliveries required 2 to 3 hours of pushing and  forceps deliveries, which is true, and she did not want to repeat the  vaginal delivery process.  I told her that I was not able to say she could  not have a Cesarean section but it is not what I would recommended.  Her  three hour GTT was 78, 169, 162, 111.  On July 28, 2005 the patient told  me again that she was not a candidate for vaginal delivery.  She told me  that she took a long time to re-acquire bladder control, states that her  hemorrhoids were worsened by the deliveries and that she wanted to schedule  a Cesarean section and I approved.  The patient came to the maternity  admission unit at 6 cm dilated.  I was called and told that she was 6 cm and  that she was in active labor.  I asked the nurse to ask Ms. Peragine if she  thought she could proceed on with a vaginal delivery since vaginal delivery  seemed like it would be relatively Advertising account executive. However, the patient  declined.  The MAU staff sent the patient to the operating room.  She was given a  spinal anesthetic and then I arrived and she was already prepped for  delivery.   PAST MEDICAL HISTORY:  Reveals that she did have pregnancy-induced  hypertension X2.  She has had acute gastritis in 2000.  She has had a  history of depression but is on no current medications.   PAST SURGICAL HISTORY:  She did have a cholecystectomy in 2003.   ALLERGIES:  None.   FAMILY HISTORY:  Revealed her mother has thyroid dysfunction.  Grandparents  have high blood pressure and heart disease.   PHYSICAL EXAMINATION:  GENERAL APPEARANCE:  Physical examination revealed a  well-developed, well-nourished white female lying on the operating room  table, prepared for Cesarean section at her strong request.  VITAL SIGNS:  Blood pressure 120/75, pulse 100.  HEAD, EYES, EARS, NOSE AND THROAT:  Normal.  HEART AND LUNGS:  No abnormalities.  ABDOMEN:  Distended with term size fundus.  PELVIC:  The patient was not re-examined in the operating room for the  pelvic examination.   ADMITTING IMPRESSION:  Intrauterine  pregnancy at 37+ weeks, active labor,  declined vaginal birth.   PLAN:  The patient is proceeding with a Cesarean section.      [name], M.D.  Electronically Signed     TFH/MEDQ  D:  08/18/2005  T:  08/18/2005  Job:  [id number]

## 2011-07-14 LAB — POCT PREGNANCY, URINE: Preg Test, Ur: NEGATIVE

## 2011-07-14 LAB — CBC
HCT: 36.7
HCT: 41.4
Hemoglobin: 12.2
Hemoglobin: 14.2
MCHC: 34
MCHC: 34.2
MCV: 89.1
MCV: 89.3
MCV: 89.4
Platelets: 207
Platelets: 215
Platelets: 256
RBC: 4.1
RBC: 4.23
RDW: 12.9
WBC: 10
WBC: 14.9 — ABNORMAL HIGH
WBC: 8.6

## 2011-07-14 LAB — PROTIME-INR
INR: 0.9
INR: 1
Prothrombin Time: 13.4
Prothrombin Time: 14.9

## 2011-07-14 LAB — URINALYSIS, ROUTINE W REFLEX MICROSCOPIC
Bilirubin Urine: NEGATIVE
Glucose, UA: NEGATIVE
Ketones, ur: NEGATIVE
Leukocytes, UA: NEGATIVE
Nitrite: NEGATIVE
Protein, ur: NEGATIVE
Specific Gravity, Urine: 1.017
Urobilinogen, UA: 0.2
pH: 6

## 2011-07-14 LAB — COMPREHENSIVE METABOLIC PANEL
Albumin: 2.8 — ABNORMAL LOW
Alkaline Phosphatase: 56
BUN: 13
BUN: 5 — ABNORMAL LOW
BUN: 6
CO2: 21
CO2: 23
Calcium: 8.7
Chloride: 107
Chloride: 109
Chloride: 109
Creatinine, Ser: 0.53
Creatinine, Ser: 0.64
Creatinine, Ser: 0.8
GFR calc non Af Amer: >60
GFR calc non Af Amer: >60
Glucose, Bld: 113 — ABNORMAL HIGH
Potassium: 3.8
Total Bilirubin: 0.7
Total Bilirubin: 1.2
Total Bilirubin: 4 — ABNORMAL HIGH
Total Protein: 6.2

## 2011-07-14 LAB — DIFFERENTIAL
Basophils Absolute: 0.1
Basophils Absolute: 0.1
Basophils Relative: 0
Lymphocytes Relative: 14
Lymphocytes Relative: 19
Monocytes Absolute: 1.2 — ABNORMAL HIGH
Neutro Abs: 10.7 — ABNORMAL HIGH
Neutro Abs: 7.6
Neutrophils Relative %: 72

## 2011-07-14 LAB — POCT CARDIAC MARKERS
CKMB, poc: <1 — ABNORMAL LOW
Myoglobin, poc: 45.5

## 2011-07-14 LAB — URINE MICROSCOPIC-ADD ON

## 2011-07-14 LAB — HEPATITIS B SURFACE ANTIGEN: Hepatitis B Surface Ag: NEGATIVE

## 2011-07-14 LAB — D-DIMER, QUANTITATIVE: D-Dimer, Quant: 0.75 — ABNORMAL HIGH

## 2011-07-14 LAB — PHOSPHORUS: Phosphorus: 3.8

## 2011-07-14 LAB — APTT: aPTT: 120 — ABNORMAL HIGH

## 2011-07-14 LAB — CARDIAC PANEL(CRET KIN+CKTOT+MB+TROPI)
Total CK: 81
Troponin I: 0.01
Troponin I: 0.01

## 2011-07-14 LAB — LIPASE, BLOOD: Lipase: 20

## 2011-07-14 LAB — HEPATITIS A ANTIBODY, IGM: Hep A IgM: NEGATIVE

## 2011-07-14 LAB — HEPARIN LEVEL (UNFRACTIONATED): Heparin Unfractionated: 0.56

## 2011-07-14 LAB — HEPATITIS C ANTIBODY: HCV Ab: NEGATIVE

## 2011-07-14 LAB — MAGNESIUM: Magnesium: 2

## 2011-12-28 ENCOUNTER — Other Ambulatory Visit (HOSPITAL_COMMUNITY)
Admission: RE | Admit: 2011-12-28 | Discharge: 2011-12-28 | Disposition: A | Discharge status: Home / Self Care | Payer: BC Managed Care – PPO | Source: Ambulatory Visit | Attending: Obstetrics and Gynecology | Admitting: Obstetrics and Gynecology

## 2011-12-28 ENCOUNTER — Other Ambulatory Visit: Payer: Self-pay | Admitting: Obstetrics and Gynecology

## 2011-12-28 DIAGNOSIS — Z01419 Encounter for gynecological examination (general) (routine) without abnormal findings: Secondary | ICD-10-CM | POA: Insufficient documentation

## 2012-03-14 ENCOUNTER — Ambulatory Visit (INDEPENDENT_AMBULATORY_CARE_PROVIDER_SITE_OTHER): Payer: BC Managed Care – PPO | Admitting: Family Medicine

## 2012-03-14 ENCOUNTER — Encounter: Payer: Self-pay | Admitting: Family Medicine

## 2012-03-14 VITALS — BP 116/75 | HR 92 | Ht 67.0 in | Wt 180.0 lb

## 2012-03-14 DIAGNOSIS — M722 Plantar fascial fibromatosis: Secondary | ICD-10-CM | POA: Insufficient documentation

## 2012-03-14 MED ORDER — DICLOFENAC SODIUM 75 MG PO TBEC: 75.0000 mg | DELAYED_RELEASE_TABLET | Freq: Two times a day (BID) | ORAL | Status: AC

## 2012-03-14 NOTE — Progress Notes (Signed)
  Subjective:    Patient ID: [name], female    DOB: 1970-11-07, 41 y.o.   MRN: [id number]  HPI  Right hell plantar fascitis for 1 year, has worsen 2 month, sharp pain, worse in the morning with the first step. Had cortisone shots in April with no much improvement.  There is no problem list on file for this patient.  Current Outpatient Prescriptions on File Prior to Visit  Medication Sig Dispense Refill  . escitalopram (LEXAPRO) 20 MG tablet Take 20 mg by mouth daily.      . traZODone (DESYREL) 50 MG tablet Take 50 mg by mouth at bedtime.       No Known Allergies   Review of Systems  Constitutional: Negative for fever, chills, diaphoresis and fatigue.  Musculoskeletal: Negative for back pain, joint swelling, arthralgias and gait problem.  Neurological: Negative for weakness and numbness.       Objective:   Physical Exam  Constitutional: She is oriented to person, place, and time. She appears well-developed and well-nourished.       BP 116/75  Pulse 92  Ht 5\' 7"  (1.702 m)  Wt 180 lb (81.647 kg)  BMI 28.19 kg/m2   Pulmonary/Chest: Effort normal.  Musculoskeletal:       Right  heel with intact, no swelling, no hematoma There is tender to palpation in the mid plantar aspect of the left heel at the insertion of the plantar fascia and the calcaneus. There is mild tenderness at the plantar fascia in the mid arch.  Neurological: She is alert and oriented to person, place, and time.  Skin: Skin is warm. No rash noted. No erythema.  Psychiatric: She has a normal mood and affect. Her behavior is normal.     MSK U/S : Shows PF thickening 0.56cm. Bone spur present.       Assessment & Plan:   1. Plantar fasciitis, right    voltaren tab twice a day Ice massage Stretches Heel cups Use heel straps.

## 2012-03-14 NOTE — Patient Instructions (Addendum)
voltaren tab twice a day Ice massage Stretches Heel cups Use arch straps.

## 2012-06-18 ENCOUNTER — Encounter: Payer: Self-pay | Admitting: Sports Medicine

## 2012-06-18 ENCOUNTER — Ambulatory Visit (INDEPENDENT_AMBULATORY_CARE_PROVIDER_SITE_OTHER): Payer: BC Managed Care – PPO | Admitting: Sports Medicine

## 2012-06-18 VITALS — BP 129/91 | HR 86

## 2012-06-18 DIAGNOSIS — M722 Plantar fascial fibromatosis: Secondary | ICD-10-CM

## 2012-06-18 NOTE — Progress Notes (Signed)
  Subjective:    Patient ID: [name], female    DOB: [birth year], 41 y.o.   MRN: [id number]  HPI Patient is 41 y/o female presenting for continued right plantar fasciitis symptoms. Patient first had right heel pain approximately 7-8 years ago that was successfully treated with corticosteroid injection and orthotics at that time. Patient's current episode of plantar fasciitis started > 1 year ago and has continued to worsen. She has tried steroid injections x 2, massage, ice bottle massage, stair calf raise exercises, calf stretch, arch strap, and heel cups. None of these have helped. She only got one day of relief from injections previously. Ibuprofen and diclofenac do not help.    Review of Systems     Objective:   Physical Exam Patient has focal pain on palpation over the proximal medial attachment site of the plantar fascia. There is no other tenderness on exam. No arch TTP. Normal ankle ROM. No achilles tenderness.  Ultrasound Right Foot - Thickened plantar fascia at proximal calcaneal attachment. Nodular thickening of 0.7 cm noted with edematous fluid near calcaneal attachment - 1 cm heel spur    Assessment & Plan:  #1. Right Plantar fasciitis, refractory to conservative care thus far.   - Patient will return with her running shoes for custom orthotics  - Continue stretching and heel raise exercises  - Discontinue arch strap if it does not help  - Will need to consider more invasive interventions such as surgery in the future if the orthotics are not effective in relieving symptoms.

## 2012-06-18 NOTE — Assessment & Plan Note (Signed)
See plan in note  She will also find good shoes to use orthotics at work

## 2012-06-18 NOTE — Patient Instructions (Signed)
1. Return at your earliest convenience for custom orthotics. 2. Continue stretching and calf raises 3. Ice at least 2 times daily in ice bath 4. You may stop the arch strap if it doesn't help your pain.

## 2012-06-25 ENCOUNTER — Ambulatory Visit (INDEPENDENT_AMBULATORY_CARE_PROVIDER_SITE_OTHER): Payer: BC Managed Care – PPO | Admitting: Family Medicine

## 2012-06-25 VITALS — BP 110/70 | Ht 67.0 in | Wt 180.0 lb

## 2012-06-25 DIAGNOSIS — M722 Plantar fascial fibromatosis: Secondary | ICD-10-CM

## 2012-06-25 NOTE — Progress Notes (Signed)
  Subjective:    Patient ID: [name], female    DOB: September 27, 1971, 41 y.o.   MRN: [id number]  HPI Patient is 41 y/o female presenting for continued right plantar fasciitis symptoms.patient has been doing the stretching exercises and does improve somewhat. Patient though still had significant pain with the first up in the morning and after long sitting. Patient also is unable to walk secondary to the pain sometimes. Patient states though she can get going the pain seems to improve somewhat. Patient is here for orthotics.   Review of Systems Denies fever, chills, nausea vomiting abdominal pain, dysuria, chest pain, shortness of breath dyspnea on exertion or numbness in extremities     Objective:   Physical Exam Patient has focal pain on palpation over the proximal medial attachment site of the plantar fascia. There is no other tenderness on exam. No arch TTP. Normal ankle ROM. No achilles tenderness. Gait shows that patient does have some mild overpronation occurring of the heel bilaterally. Patient also has some out toeing bilaterally.   Patient was fitted for a : standard, cushioned, semi-rigid orthotic. The orthotic was heated and afterward the patient stood on the orthotic blank positioned on the orthotic stand. The patient was positioned in subtalar neutral position and 10 degrees of ankle dorsiflexion in a weight bearing stance. After completion of molding, a stable base was applied to the orthotic blank. The blank was ground to a stable position for weight bearing. Size:7 Base:EVA blue Posting:none Additional orthotic padding:none  Patient had improvement in gait with patient's hindfoot been in a more neutral position and outgoing improved especially on the left foot which is in a much more neutral position.

## 2012-06-25 NOTE — Assessment & Plan Note (Signed)
Patient made orthotics today. Wear these daily, encouraged to increase duration of wearing slowly. Patient will come back in 2-3 weeks and, can come back sooner if adjustments needed.encourage patient to continue with the stretches as well as the icing. Patient does have the large heel spur which could be contributing and may need some type of intervention in the future.

## 2012-07-30 ENCOUNTER — Ambulatory Visit: Payer: BC Managed Care – PPO | Admitting: Sports Medicine

## 2012-08-01 ENCOUNTER — Ambulatory Visit: Payer: BC Managed Care – PPO | Admitting: Sports Medicine

## 2012-08-06 ENCOUNTER — Ambulatory Visit (INDEPENDENT_AMBULATORY_CARE_PROVIDER_SITE_OTHER): Payer: BC Managed Care – PPO | Admitting: Sports Medicine

## 2012-08-06 ENCOUNTER — Encounter: Payer: Self-pay | Admitting: Sports Medicine

## 2012-08-06 VITALS — BP 132/89 | HR 80 | Ht 67.0 in | Wt 180.0 lb

## 2012-08-06 DIAGNOSIS — M722 Plantar fascial fibromatosis: Secondary | ICD-10-CM

## 2012-08-06 NOTE — Assessment & Plan Note (Signed)
Plantar fasciitis so chronic we will try adding medications to see if we can get her better relief  Begin amitriptyline 50 at night and hold her trazodone  Start tramadol 50 twice a day  Keep up the standard exercises, stretches and use of orthotics  Recheck in one month

## 2012-08-06 NOTE — Progress Notes (Signed)
Patient ID: [name], female   DOB: September 23, 1971, 41 y.o.   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 [place] We started treating her plantar fasciitis of the right foot several months ago  She had had prolonged chronic pain and had failed injection x2  Since we put her on exercises, stretches and orthotics she feels that she's had at least a 50% reduction in her pain level However, the heel is still more painful when she has to stand much of the day or does too much walking  She would like to improve this to a level where she feels that she could do more exercise  Examination Pleasant and in no acute distress Excellent great toe motion Tenderness at the medial insertion of the plantar fascia on the right only Standing she has a cavus foot No swelling

## 2012-09-11 ENCOUNTER — Ambulatory Visit: Payer: BC Managed Care – PPO | Admitting: Sports Medicine

## 2012-10-18 ENCOUNTER — Other Ambulatory Visit: Payer: Self-pay | Admitting: *Deleted

## 2012-10-18 MED ORDER — AMITRIPTYLINE HCL 25 MG PO TABS: 25.0000 mg | ORAL_TABLET | Freq: Every day | ORAL | Status: DC

## 2012-12-18 ENCOUNTER — Other Ambulatory Visit: Payer: Self-pay | Admitting: *Deleted

## 2012-12-18 MED ORDER — AMITRIPTYLINE HCL 25 MG PO TABS: 25.0000 mg | ORAL_TABLET | Freq: Every day | ORAL | Status: DC

## 2013-04-15 ENCOUNTER — Other Ambulatory Visit: Payer: Self-pay | Admitting: *Deleted

## 2013-04-15 MED ORDER — AMITRIPTYLINE HCL 25 MG PO TABS: 25.0000 mg | ORAL_TABLET | Freq: Every day | ORAL | Status: DC

## 2013-04-15 NOTE — Progress Notes (Signed)
Refilled per Dr. Fields  

## 2013-12-02 ENCOUNTER — Other Ambulatory Visit: Payer: Self-pay | Admitting: Adult Health

## 2014-01-06 ENCOUNTER — Other Ambulatory Visit: Payer: Self-pay | Admitting: Adult Health

## 2014-02-02 ENCOUNTER — Other Ambulatory Visit (HOSPITAL_COMMUNITY)
Admission: RE | Admit: 2014-02-02 | Discharge: 2014-02-02 | Disposition: A | Discharge status: Home / Self Care | Payer: BC Managed Care – PPO | Source: Ambulatory Visit | Attending: Adult Health | Admitting: Adult Health

## 2014-02-02 ENCOUNTER — Ambulatory Visit (INDEPENDENT_AMBULATORY_CARE_PROVIDER_SITE_OTHER): Payer: BC Managed Care – PPO | Admitting: Adult Health

## 2014-02-02 ENCOUNTER — Encounter: Payer: Self-pay | Admitting: Adult Health

## 2014-02-02 VITALS — BP 122/60 | HR 74 | Ht 66.0 in | Wt 196.0 lb

## 2014-02-02 DIAGNOSIS — Z01419 Encounter for gynecological examination (general) (routine) without abnormal findings: Secondary | ICD-10-CM

## 2014-02-02 DIAGNOSIS — Z1212 Encounter for screening for malignant neoplasm of rectum: Secondary | ICD-10-CM

## 2014-02-02 DIAGNOSIS — Z1151 Encounter for screening for human papillomavirus (HPV): Secondary | ICD-10-CM | POA: Insufficient documentation

## 2014-02-02 LAB — HEMOCCULT GUIAC POC 1CARD (OFFICE): FECAL OCCULT BLD: NEGATIVE

## 2014-02-02 NOTE — Patient Instructions (Signed)
Physical in 1 year Pap in 3 years if negative HPV Mammogram now and yearly

## 2014-02-02 NOTE — Progress Notes (Signed)
Patient ID: [name], female   DOB: 12/20/1970, 43 y.o.   MRN: [id number] History of Present Illness: Idalia Needleaige is a 43 year old white female, married in for pap and physical.   Current Medications, Allergies, Past Medical History, Past Surgical History, Family History and Social History were reviewed in [facility] electronic medical record.     Review of Systems: Patient denies any headaches, blurred vision, shortness of breath, chest pain, abdominal pain, problems with bowel movements(some constipation), urination, or intercourse. No joint pain has had plantar fasciitis, no mood swings    Physical Exam:BP 122/60  Pulse 74  Ht 5\' 6"  (1.676 m)  Wt 196 lb (88.905 kg)  BMI 31.65 kg/m2  LMP 01/05/2014 General:  Well developed, well nourished, no acute distress Skin:  Warm and dry Neck:  Midline trachea, normal thyroid Lungs; Clear to auscultation bilaterally Breast:  No dominant palpable mass, retraction, or nipple discharge Cardiovascular: Regular rate and rhythm Abdomen:  Soft, non tender, no hepatosplenomegaly Pelvic:  External genitalia is normal in appearance.  The vagina is normal in appearance.   The cervix is bulbous.Pap with HPV performed.  Uterus is felt to be normal size, shape, and contour.  No                adnexal masses or tenderness noted. Rectal: Good sphincter tone, no polyps,  hemorrhoids felt.  Hemoccult negative. Extremities:  No swelling or varicosities noted Psych:  No mood changes, alert and cooperative,seems happy   Impression: Yearly gyn exam    Plan: Physical in 1 year Mammogram now and yearly Try prunes daily.

## 2014-08-17 ENCOUNTER — Encounter: Payer: Self-pay | Admitting: Adult Health

## 2016-04-23 ENCOUNTER — Emergency Department (HOSPITAL_COMMUNITY): Payer: BC Managed Care – PPO

## 2016-04-23 ENCOUNTER — Emergency Department (HOSPITAL_COMMUNITY)
Admission: EM | Admit: 2016-04-23 | Discharge: 2016-04-23 | Disposition: A | Discharge status: Home / Self Care | Payer: BC Managed Care – PPO | Attending: Emergency Medicine | Admitting: Emergency Medicine

## 2016-04-23 ENCOUNTER — Encounter (HOSPITAL_COMMUNITY): Payer: Self-pay | Admitting: Emergency Medicine

## 2016-04-23 DIAGNOSIS — Y929 Unspecified place or not applicable: Secondary | ICD-10-CM | POA: Diagnosis not present

## 2016-04-23 DIAGNOSIS — Y939 Activity, unspecified: Secondary | ICD-10-CM | POA: Diagnosis not present

## 2016-04-23 DIAGNOSIS — S8991XA Unspecified injury of right lower leg, initial encounter: Secondary | ICD-10-CM | POA: Diagnosis present

## 2016-04-23 DIAGNOSIS — Y999 Unspecified external cause status: Secondary | ICD-10-CM | POA: Insufficient documentation

## 2016-04-23 DIAGNOSIS — W010XXA Fall on same level from slipping, tripping and stumbling without subsequent striking against object, initial encounter: Secondary | ICD-10-CM | POA: Diagnosis not present

## 2016-04-23 DIAGNOSIS — S82001A Unspecified fracture of right patella, initial encounter for closed fracture: Secondary | ICD-10-CM | POA: Diagnosis not present

## 2016-04-23 MED ORDER — IBUPROFEN 800 MG PO TABS: 800.0000 mg | ORAL_TABLET | Freq: Three times a day (TID) | ORAL | Status: DC | PRN

## 2016-04-23 MED ORDER — HYDROCODONE-ACETAMINOPHEN 5-325 MG PO TABS: 1.0000 | ORAL_TABLET | ORAL | Status: DC | PRN

## 2016-04-23 NOTE — ED Provider Notes (Signed)
CSN: [id number]     Arrival date & time 04/23/16  2113 History   First MD Initiated Contact with Patient 04/23/16 2149     Chief Complaint  Patient presents with  . Knee Injury     (Consider location/radiation/quality/duration/timing/severity/associated sxs/prior Treatment) HPI   Patient presents with pain in her right anterior knee that began today after she fell trying to step over her dog gate.  States she got her foot caught on the gate and fell to the ground directly on her right knee cap.  She also scraped her right elbow but denies pain.  States pain just over her right knee cap and over medial and inferolateral aspects of knee with radiation down into lower leg, exacerbated by bearing weight.  Denies any other injuries, including head injury, denies LOC.  Denies weakness or numbness of the leg.  She initially walked on the leg but c/o significant soreness.    Past Medical History  Diagnosis Date  . Plantar fasciitis    Past Surgical History  Procedure Laterality Date  . Cholecystectomy    . Cesarean section     Family History  Problem Relation Age of Onset  . Hypertension Father   . Heart disease Paternal Aunt   . Heart disease Paternal Uncle   . Cancer Maternal Grandfather     leukemia  . Stroke Maternal Grandfather    Social History  Substance Use Topics  . Smoking status: Never Smoker   . Smokeless tobacco: Never Used  . Alcohol Use: No     Comment: occ.   OB History    Gravida Para Term Preterm AB TAB SAB Ectopic Multiple Living   3 3        3      Review of Systems  Constitutional: Negative for fever.  Cardiovascular: Negative for leg swelling.  Musculoskeletal: Positive for arthralgias.  Skin: Negative for color change and pallor.  Allergic/Immunologic: Negative for immunocompromised state.  Neurological: Negative for weakness and numbness.  Hematological: Does not bruise/bleed easily.  Psychiatric/Behavioral: Negative for self-injury.       Allergies  Review of patient's allergies indicates no known allergies.  Home Medications   Prior to Admission medications   Medication Sig Start Date End Date Taking? Authorizing Provider  DULoxetine (CYMBALTA) 60 MG capsule Take 60 mg by mouth daily.    Historical Provider, MD  traZODone (DESYREL) 50 MG tablet Take 50 mg by mouth at bedtime.    Historical Provider, MD   BP 139/91 mmHg  Pulse 93  Temp(Src) 98.2 F (36.8 C) (Oral)  Resp 16  SpO2 100%  LMP 04/21/2016 (Approximate) Physical Exam  Constitutional: She appears well-developed and well-nourished. No distress.  HENT:  Head: Normocephalic and atraumatic.  Neck: Neck supple.  Pulmonary/Chest: Effort normal.  Musculoskeletal:       Right knee: She exhibits normal range of motion, no swelling, no ecchymosis, no deformity, no laceration, no erythema, normal alignment, no LCL laxity and no MCL laxity. Tenderness found.       Right ankle: Normal.       Right upper leg: Normal.       Right lower leg: Normal.  Mild tenderness and very superficial abrasion over patella.  Tenderness over medial knee and inferolateral to patella.  No crepitus.  Distal pulses and sensation intact.    Neurological: She is alert.  Skin: She is not diaphoretic.  Nursing note and vitals reviewed.   ED Course  Procedures (including critical care time) Labs  Review Labs Reviewed - No data to display  Imaging Review Dg Knee Complete 4 Views Right  04/23/2016  CLINICAL DATA:  Fall, right anterior knee pain EXAM: RIGHT KNEE - COMPLETE 4+ VIEW COMPARISON:  None. FINDINGS: Mild cortical irregularity along the anterior/inferior aspect of the patella on the lateral view. While this cannot be confirmed on additional views, the appearance is worrisome for nondisplaced inferior patellar fracture. No additional fracture is seen. The joint spaces are preserved. Suspected small suprapatellar knee joint effusion. IMPRESSION: Possible nondisplaced anterior/  inferior patellar fracture, although equivocal. Correlate with point tenderness. Small suprapatellar knee joint effusion. Electronically Signed   By: Charline Bills M.D.   On: 04/23/2016 21:57   I have personally reviewed and evaluated these images and lab results as part of my medical decision-making.   EKG Interpretation None      MDM   Final diagnoses:  Right patella fracture, closed, initial encounter   Afebrile, nontoxic patient with injury to her right knee after falling over a dog gate while trying to step over it.   Xray demonstrates fracture of patella, nondisplaced.  Pt is tender in this location.  Reviewed xray with patient.  Patient's spouse has seen Dr Charlann Boxer in the past, she would like to follow up with him.  Neurovascularly intact.  D/C home with pain medication, knee immobilizer, crutches, orthopedic follow up.  Discussed result, findings, treatment, and follow up  with patient.  Pt given return precautions.  Pt verbalizes understanding and agrees with plan.        Trixie Dredge, PA-C 04/23/16 2208  Lorre Nick, MD 04/28/16 [phone]

## 2016-04-23 NOTE — Discharge Instructions (Signed)
Read the information below.  Use the prescribed medication as directed.  Please discuss all new medications with your pharmacist.  You may return to the Emergency Department at any time for worsening condition or any new symptoms that concern you.    If you develop uncontrolled pain, weakness or numbness of the extremity, severe discoloration of the skin, or you are unable to walk or move your knee, return to the ER for a recheck.     How to Use a Knee Brace A knee brace is a device that you wear to support your knee, especially if the knee is healing after an injury or surgery. There are several types of knee braces. Some are designed to prevent an injury (prophylactic brace). These are often worn during sports. Others support an injured knee (functional brace) or keep it still while it heals (rehabilitative brace). People with severe arthritis of the knee may benefit from a brace that takes some pressure off the knee (unloader brace). Most knee braces are made from a combination of cloth and metal or plastic.  You may need to wear a knee brace to:  Relieve knee pain.  Help your knee support your weight (improve stability).  Help you walk farther (improve mobility).  Prevent injury.  Support your knee while it heals from surgery or from an injury. RISKS AND COMPLICATIONS Generally, knee braces are very safe to wear. However, problems may occur, including:  Skin irritation that may lead to infection.  Making your condition worse if you wear the brace in the wrong way. HOW TO USE A KNEE BRACE Different braces will have different instructions for use. Your health care provider will tell you or show you:  How to put on your brace.  How to adjust the brace.  When and how often to wear the brace.  How to remove the brace.  If you will need any assistive devices in addition to the brace, such as crutches or a cane. In general, your brace should:  Have the hinge of the brace line up with  the bend of your knee.  Have straps, hooks, or tapes that fasten snugly around your leg.  Not feel too tight or too loose. HOW TO CARE FOR A KNEE BRACE  Check your brace often for signs of damage, such as loose connections or attachments. Your knee brace may get damaged or wear out during normal use.  Wash the fabric parts of your brace with soap and water.  Read the insert that comes with your brace for other specific care instructions. SEEK MEDICAL CARE IF:  Your knee brace is too loose or too tight and you cannot adjust it.  Your knee brace causes skin redness, swelling, bruising, or irritation.  Your knee brace is not helping.  Your knee brace is making your knee pain worse.   This information is not intended to replace advice given to you by your health care provider. Make sure you discuss any questions you have with your health care provider.   Document Released: 12/23/2003 Document Revised: 06/23/2015 Document Reviewed: 01/25/2015 Elsevier Interactive Patient Education 2016 Elsevier Inc.   Patellar Fracture, Adult A patellar fracture is a break in your kneecap (patella).  CAUSES   A direct blow to the knee or a fall is usually the cause of a broken patella.  A very hard and strong bending of your knee can cause a patellar fracture. RISK FACTORS Involvement in contact sports, especially sports that involve a lot of  jumping. SIGNS AND SYMPTOMS   Tender and swollen knee.  Pain when you move your knee, especially when you try to straighten out your leg.  Difficulty walking or putting weight on your knee.  Misshapen knee (as if a bone is out of place). DIAGNOSIS  Patellar fracture is usually diagnosed with a physical exam and an X-ray exam. TREATMENT  Treatment depends on the type of fracture:  If your patella is still in the right position after the fracture and you can still straighten your leg out, you can usually be treated with a splint or cast for 4-6  weeks.  If your patella is broken into multiple small pieces but you are able to straighten your leg, you can usually be treated with a splint or cast for 4-6 weeks. Sometimes your patella may need to be removed before the cast is applied.  If you cannot straighten out your leg after a patellar fracture, then surgery is required to hold the bony fragments together until they heal. A cast or splint will be applied for 4-6 weeks. HOME CARE INSTRUCTIONS   Only take over-the-counter or prescription medicines for pain, discomfort, or fever as directed by your health care provider.  Use crutches as directed, and exercise the leg as directed.  Apply ice to the injured area:  Put ice in a plastic bag.  Place a towel between your skin and the bag.  Leave the ice on for 20 minutes, 2-3 times a day.  Elevate the affected knee above the level of your heart. SEEK MEDICAL CARE IF:  You suspect you have significantly injured your knee.  You hear a pop after a knee injury.  Your knee is misshapen after a knee injury.  You have pain when you move your knee.  You have difficulty walking or putting weight on your knee.  You cannot fully move your knee. SEEK IMMEDIATE MEDICAL CARE IF:  You have redness, swelling, or increasing pain in your knee.  You have a fever.   This information is not intended to replace advice given to you by your health care provider. Make sure you discuss any questions you have with your health care provider.   Document Released: 07/01/2003 Document Revised: 07/23/2013 Document Reviewed: 05/14/2013 Elsevier Interactive Patient Education [employer]2016 Elsevier Inc.

## 2016-04-23 NOTE — ED Notes (Signed)
Pt states that she fell on her R knee tonight after tripping. Pain in the knee cap at this time. Alert and oriented.

## 2016-12-05 ENCOUNTER — Other Ambulatory Visit: Payer: BC Managed Care – PPO | Admitting: Adult Health

## 2017-01-10 ENCOUNTER — Other Ambulatory Visit (HOSPITAL_COMMUNITY)
Admission: RE | Admit: 2017-01-10 | Discharge: 2017-01-10 | Disposition: A | Discharge status: Home / Self Care | Payer: BC Managed Care – PPO | Source: Ambulatory Visit | Attending: Adult Health | Admitting: Adult Health

## 2017-01-10 ENCOUNTER — Ambulatory Visit (INDEPENDENT_AMBULATORY_CARE_PROVIDER_SITE_OTHER): Payer: BC Managed Care – PPO | Admitting: Adult Health

## 2017-01-10 ENCOUNTER — Encounter: Payer: Self-pay | Admitting: Adult Health

## 2017-01-10 VITALS — BP 130/88 | HR 80 | Ht 66.5 in | Wt 208.0 lb

## 2017-01-10 DIAGNOSIS — Z01419 Encounter for gynecological examination (general) (routine) without abnormal findings: Secondary | ICD-10-CM | POA: Diagnosis not present

## 2017-01-10 DIAGNOSIS — Z1151 Encounter for screening for human papillomavirus (HPV): Secondary | ICD-10-CM | POA: Insufficient documentation

## 2017-01-10 DIAGNOSIS — Z1211 Encounter for screening for malignant neoplasm of colon: Secondary | ICD-10-CM | POA: Diagnosis not present

## 2017-01-10 DIAGNOSIS — Z1212 Encounter for screening for malignant neoplasm of rectum: Secondary | ICD-10-CM | POA: Diagnosis not present

## 2017-01-10 DIAGNOSIS — K219 Gastro-esophageal reflux disease without esophagitis: Secondary | ICD-10-CM | POA: Diagnosis not present

## 2017-01-10 LAB — HEMOCCULT GUIAC POC 1CARD (OFFICE): FECAL OCCULT BLD: NEGATIVE

## 2017-01-10 NOTE — Progress Notes (Signed)
Patient ID: [name], female   DOB: Nov 30, 1970, 46 y.o.   MRN: [id number] History of Present Illness:  Penny Needleaige is a 46 year old white female, married in for well woman gyn exam and pap.She is a Runner, broadcasting/film/videoteacher.  PCP is Dr Garner Nashaniels in [place].  Current Medications, Allergies, Past Medical History, Past Surgical History, Family History and Social History were reviewed in [facility] electronic medical record.     Review of Systems:  Patient denies any headaches, hearing loss, fatigue, blurred vision, shortness of breath, chest pain, abdominal pain, problems with bowel movements, urination, or intercourse. No joint pain or mood swings.Having reflux on prevacid, periods heavy for 3 days and bleeds for 6-7 , but not interested in intervention at present.   Physical Exam:BP 130/88 (BP Location: Left Arm, Patient Position: Sitting, Cuff Size: Normal)   Pulse 80   Ht 5' 6.5" (1.689 m)   Wt 208 lb (94.3 kg)   LMP 12/14/2016 (Approximate)   BMI 33.07 kg/m  General:  Well developed, well nourished, no acute distress Skin:  Warm and dry Neck:  Midline trachea, normal thyroid, good ROM, no lymphadenopathy Lungs; Clear to auscultation bilaterally Breast:  No dominant palpable mass, retraction, or nipple discharge Cardiovascular: Regular rate and rhythm Abdomen:  Soft, non tender, no hepatosplenomegaly Pelvic:  External genitalia is normal in appearance, no lesions.  The vagina is normal in appearance. Urethra has no lesions or masses. The cervix is bulbous.  Uterus is felt to be normal size, shape, and contour.  No adnexal masses or tenderness noted.Bladder is non tender, no masses felt. Rectal: Good sphincter tone, no polyps, or hemorrhoids felt.  Hemoccult negative. Extremities/musculoskeletal:  No swelling or varicosities noted, no clubbing or cyanosis Psych:  No mood changes, alert and cooperative,seems happy PHQ 2 score 0/  Impression: 1. Encounter for gynecological examination with Papanicolaou  smear of cervix   2. Screening for colorectal cancer   3. Gastroesophageal reflux disease without esophagitis       Plan: Try Nexium 24 HR, OTC 1 daily Physical in 1 year Pap in 3 if normal Mammogram yearly Labs with PCP

## 2017-01-15 LAB — CYTOLOGY - PAP
Diagnosis: NEGATIVE
HPV (WINDOPATH): NOT DETECTED

## 2017-01-16 ENCOUNTER — Encounter: Payer: Self-pay | Admitting: Adult Health

## 2019-07-16 ENCOUNTER — Other Ambulatory Visit: Payer: Self-pay

## 2019-07-16 DIAGNOSIS — Z20822 Contact with and (suspected) exposure to covid-19: Secondary | ICD-10-CM

## 2019-07-18 ENCOUNTER — Telehealth: Payer: Self-pay | Admitting: General Practice

## 2019-07-18 LAB — NOVEL CORONAVIRUS, NAA: SARS-CoV-2, NAA: NOT DETECTED

## 2019-07-18 NOTE — Telephone Encounter (Signed)
Negative COVID results given. Patient results "NOT Detected." Caller expressed understanding. ° °

## 2021-04-04 ENCOUNTER — Encounter: Payer: Self-pay | Admitting: Adult Health

## 2021-04-04 ENCOUNTER — Ambulatory Visit (INDEPENDENT_AMBULATORY_CARE_PROVIDER_SITE_OTHER): Payer: BC Managed Care – PPO | Admitting: Adult Health

## 2021-04-04 ENCOUNTER — Other Ambulatory Visit (HOSPITAL_COMMUNITY)
Admission: RE | Admit: 2021-04-04 | Discharge: 2021-04-04 | Disposition: A | Discharge status: Home / Self Care | Payer: BC Managed Care – PPO | Source: Ambulatory Visit | Attending: Adult Health | Admitting: Adult Health

## 2021-04-04 ENCOUNTER — Other Ambulatory Visit: Payer: Self-pay

## 2021-04-04 VITALS — BP 131/88 | HR 85 | Ht 66.0 in | Wt 218.0 lb

## 2021-04-04 DIAGNOSIS — Z780 Asymptomatic menopausal state: Secondary | ICD-10-CM

## 2021-04-04 DIAGNOSIS — Z01419 Encounter for gynecological examination (general) (routine) without abnormal findings: Secondary | ICD-10-CM | POA: Insufficient documentation

## 2021-04-04 DIAGNOSIS — N951 Menopausal and female climacteric states: Secondary | ICD-10-CM | POA: Diagnosis not present

## 2021-04-04 DIAGNOSIS — Z1211 Encounter for screening for malignant neoplasm of colon: Secondary | ICD-10-CM

## 2021-04-04 LAB — HEMOCCULT GUIAC POC 1CARD (OFFICE): Fecal Occult Blood, POC: NEGATIVE

## 2021-04-04 NOTE — Progress Notes (Signed)
Patient ID: [name], female   DOB: [birth year], 50 y.o.   MRN: [id number] History of Present Illness:  [name] is a 50 year old white female, married, G3P3 in for a well woman gyn exam and pap.Last pap was 2018. 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 PCP is Dr Reuel Boom.  Current Medications, Allergies, Past Medical History, Past Surgical History, Family History and Social History were reviewed in [facility] record.     Review of Systems: Patient denies any headaches, hearing loss, fatigue, blurred vision, shortness of breath, chest pain, abdominal pain, problems with bowel movements, urination, or intercourse. No joint pain or mood swings.  +hot flashes +skipping periods +vaginal dryness    Physical Exam:BP 131/88 (BP Location: Right Arm, Patient Position: Sitting, Cuff Size: Normal)   Pulse 85   Ht 5\' 6"  (1.676 m)   Wt 218 lb (98.9 kg)   LMP  (LMP Unknown) Comment: September 2021  BMI 35.19 kg/m   General:  Well developed, well nourished, no acute distress Skin:  Warm and dry Neck:  Midline trachea, normal thyroid, good ROM, no lymphadenopathy Lungs; Clear to auscultation bilaterally Breast:  No dominant palpable mass, retraction, or nipple discharge Cardiovascular: Regular rate and rhythm Abdomen:  Soft, non tender, no hepatosplenomegaly Pelvic:  External genitalia is normal in appearance, no lesions.  The vagina is normal in appearance. Urethra has no lesions or masses. The cervix is bulbous.Pap with HR HPV genotyping performed.  Uterus is felt to be normal size, shape, and contour.  No adnexal masses or tenderness noted.Bladder is non tender, no masses felt. Rectal: Good sphincter tone, no polyps, or hemorrhoids felt.  Hemoccult negative. Extremities/musculoskeletal:  No swelling or varicosities noted, no clubbing or cyanosis Psych:  No mood changes, alert and cooperative,seems happy AA is 3 Fall risk is low Depression screen [facility] 2/9 04/04/2021 01/10/2017   Decreased Interest 0 0  Down, Depressed, Hopeless 0 0  PHQ - 2 Score 0 0  Altered sleeping 0 -  Tired, decreased energy 0 -  Change in appetite 0 -  Feeling bad or failure about yourself  0 -  Trouble concentrating 0 -  Moving slowly or fidgety/restless 0 -  Suicidal thoughts 0 -  PHQ-9 Score 0 -    GAD 7 : Generalized Anxiety Score 04/04/2021  Nervous, Anxious, on Edge 0  Control/stop worrying 0  Worry too much - different things 0  Trouble relaxing 0  Restless 0  Easily annoyed or irritable 0  Afraid - awful might happen 0  Total GAD 7 Score 0      Upstream - 04/04/21 1518       Pregnancy Intention Screening   Does the patient want to become pregnant in the next year? No    Does the patient's partner want to become pregnant in the next year? No    Would the patient like to discuss contraceptive options today? No      Contraception Wrap Up   Current Method Vasectomy   menopausal   End Method Vasectomy    Contraception Counseling Provided No             Examination chaperoned by 04/06/21 RN  Impression and Plan: 1. Encounter for gynecological examination with Papanicolaou smear of cervix Physical with PCP  Pap in 3 years if normal Labs with PCP Mammogram yearly   2. Encounter for screening fecal occult blood testing   3. Menopause NO HRT has history  of PE  4. Vaginal  dryness, menopausal Try replens or luvena vaginal moisture  Try astroglide with sex

## 2021-04-07 LAB — CYTOLOGY - PAP
Comment: NEGATIVE
Diagnosis: NEGATIVE
High risk HPV: NEGATIVE

## 2022-03-31 ENCOUNTER — Telehealth: Payer: Self-pay | Admitting: *Deleted

## 2022-03-31 NOTE — Telephone Encounter (Signed)
Left message, call me back, can't give meds to stop bleeding at this time, when back from cruise, will get [facility] and [place]

## 2022-03-31 NOTE — Telephone Encounter (Signed)
Patient states she started having brown, spotting last Friday, full bleeding on Sunday then heavy bleeding with clots stared Wednesday.  She has gone over a year without a period and thought she was in menopause. She is leaving for a cruise on Monday and would like a call back regarding the bleeding before 2pm today if possible.  Please advise.

## 2023-08-29 ENCOUNTER — Encounter: Payer: Self-pay | Admitting: Adult Health

## 2023-09-26 ENCOUNTER — Ambulatory Visit: Payer: BC Managed Care – PPO | Admitting: Adult Health

## 2023-10-02 ENCOUNTER — Ambulatory Visit: Payer: BC Managed Care – PPO | Admitting: Adult Health

## 2023-10-16 ENCOUNTER — Telehealth: Payer: Self-pay

## 2023-10-16 ENCOUNTER — Ambulatory Visit: Payer: BC Managed Care – PPO | Admitting: Adult Health

## 2023-10-25 ENCOUNTER — Ambulatory Visit: Payer: 59 | Admitting: Adult Health

## 2023-10-25 ENCOUNTER — Other Ambulatory Visit (HOSPITAL_COMMUNITY)
Admission: RE | Admit: 2023-10-25 | Discharge: 2023-10-25 | Disposition: A | Discharge status: Home / Self Care | Payer: 59 | Source: Ambulatory Visit | Attending: Adult Health | Admitting: Adult Health

## 2023-10-25 ENCOUNTER — Encounter: Payer: Self-pay | Admitting: Adult Health

## 2023-10-25 VITALS — BP 136/93 | HR 88 | Ht 66.0 in | Wt 217.5 lb

## 2023-10-25 DIAGNOSIS — Z124 Encounter for screening for malignant neoplasm of cervix: Secondary | ICD-10-CM | POA: Diagnosis present

## 2023-10-25 DIAGNOSIS — I10 Essential (primary) hypertension: Secondary | ICD-10-CM | POA: Diagnosis not present

## 2023-10-25 DIAGNOSIS — N950 Postmenopausal bleeding: Secondary | ICD-10-CM | POA: Diagnosis not present

## 2023-10-25 DIAGNOSIS — N951 Menopausal and female climacteric states: Secondary | ICD-10-CM | POA: Diagnosis not present

## 2023-10-25 NOTE — Progress Notes (Signed)
  Subjective:     Patient ID: [name], female   DOB: 02-14-1971, 53 y.o.   MRN: [id number]  HPI [name] is a 53 year old white female,married, PM, in complaining of having vaginal bleeding from December 25-31, 2024, and it had been over a year since any bleeding. She had physical with PCP yesterday. She needs a pap too.  PCP is Dr Toribio.  Review of Systems +PMB +vaginal dryness Denies any hot flashes Reviewed past medical,surgical, social and family history. Reviewed medications and allergies.     Objective:   Physical Exam BP (!) 136/93 (BP Location: Right Arm, Patient Position: Sitting, Cuff Size: Normal)   Pulse 88   Ht 5' 6 (1.676 m)   Wt 217 lb 8 oz (98.7 kg)   LMP  (LMP Unknown) Comment: September 2021  BMI 35.11 kg/m     Skin warm and dry.Pelvic: external genitalia is normal in appearance no lesions, vagina: pale pink,urethra has no lesions or masses noted, cervix:smooth, pap with HR HPV genotyping performed, had some bleeding from os after pap, uterus: normal size, shape and contour, non tender, no masses felt, adnexa: no masses or tenderness noted. Bladder is non tender and no masses felt.  Fall risk is low  Upstream - 10/25/23 1106       Pregnancy Intention Screening   Does the patient want to become pregnant in the next year? N/A    Does the patient's partner want to become pregnant in the next year? N/A    Would the patient like to discuss contraceptive options today? N/A      Contraception Wrap Up   Current Method Vasectomy   PM   End Method Vasectomy   PM   Contraception Counseling Provided No            Examination chaperoned by Clarita Salt LPN  Assessment:     1. PMB (postmenopausal bleeding) (Primary) Had bleeding 12/25/-10/16/23 Will get pelvic US  10/30/23 at 8:30 am to assess uterus and ovaries, she is aware if has endometrial thickening will need biopsy  - US  PELVIC COMPLETE WITH TRANSVAGINAL; Future  2. Vaginal dryness, menopausal  3.  Hypertension, unspecified type Take BP meds and follow up with PCP   4. Routine Papanicolaou smear Pap sent Pap in 3 years if normal - Cytology - PAP( [facility])     Plan:     Return 10/30/23 for pelvic US

## 2023-10-26 LAB — CYTOLOGY - PAP
Comment: NEGATIVE
Diagnosis: NEGATIVE
High risk HPV: NEGATIVE

## 2023-10-30 ENCOUNTER — Ambulatory Visit (INDEPENDENT_AMBULATORY_CARE_PROVIDER_SITE_OTHER): Payer: 59

## 2023-10-30 DIAGNOSIS — N950 Postmenopausal bleeding: Secondary | ICD-10-CM

## 2023-10-30 NOTE — Progress Notes (Signed)
 PELVIC US  TA/TV: heterogeneous anteverted uterus,multiple small myometrial cysts and linear striations (?adenomyosis),complex thickened endometrium, EEC 8.2 mm,normal right ovary,simple left ovarian cyst 2.3 x 1.3 x 1.8 cm,multiple small calcification left ovary,ovaries appear mobile,no free fluid,no pain during ultrasound   Chaperone [facility]

## 2023-11-05 ENCOUNTER — Ambulatory Visit (INDEPENDENT_AMBULATORY_CARE_PROVIDER_SITE_OTHER): Payer: 59 | Admitting: Obstetrics & Gynecology

## 2023-11-05 ENCOUNTER — Encounter: Payer: Self-pay | Admitting: Obstetrics & Gynecology

## 2023-11-05 ENCOUNTER — Other Ambulatory Visit (HOSPITAL_COMMUNITY)
Admission: RE | Admit: 2023-11-05 | Discharge: 2023-11-05 | Disposition: A | Discharge status: Home / Self Care | Payer: 59 | Source: Ambulatory Visit | Attending: Obstetrics & Gynecology | Admitting: Obstetrics & Gynecology

## 2023-11-05 VITALS — BP 126/88 | HR 80 | Ht 66.0 in | Wt 217.0 lb

## 2023-11-05 DIAGNOSIS — R9389 Abnormal findings on diagnostic imaging of other specified body structures: Secondary | ICD-10-CM

## 2023-11-05 DIAGNOSIS — N950 Postmenopausal bleeding: Secondary | ICD-10-CM

## 2023-11-05 NOTE — Progress Notes (Signed)
Endometrial Biopsy Procedure Note  Pre-operative Diagnosis: Post menopausal bleeding with thickened endometrium on sonogram  Post-operative Diagnosis: same  Indications: postmenopausal bleeding  Procedure Details   Urine pregnancy test was not done.  The risks (including infection, bleeding, pain, and uterine perforation) and benefits of the procedure were explained to the patient and Written informed consent was obtained.  Antibiotic prophylaxis against endocarditis was not indicated.   The patient was placed in the dorsal lithotomy position.  Bimanual exam showed the uterus to be in the neutral position.  A Graves' speculum inserted in the vagina, and the cervix prepped with povidone iodine.  Endocervical curettage with a Kevorkian curette was not performed.   A sharp tenaculum was applied to the anterior lip of the cervix for stabilization.  A sterile uterine sound was used to sound the uterus to a depth of 6.5 cm.  A Pipelle endometrial aspirator was used to sample the endometrium.  Sample was sent for pathologic examination.  Condition: Stable  Complications: None  Plan:  The patient was advised to call for any fever or for prolonged or severe pain or bleeding. She was advised to use OTC analgesics as needed for mild to moderate pain. She was advised to avoid vaginal intercourse for 48 hours or until the bleeding has completely stopped.  Attending Physician Documentation: I was present for or performed the following: endometrial biopsy

## 2023-11-06 LAB — SURGICAL PATHOLOGY

## 2023-11-08 ENCOUNTER — Encounter: Payer: Self-pay | Admitting: Obstetrics & Gynecology

## 2023-11-08 ENCOUNTER — Telehealth: Payer: 59 | Admitting: Obstetrics & Gynecology

## 2023-11-08 DIAGNOSIS — N950 Postmenopausal bleeding: Secondary | ICD-10-CM | POA: Diagnosis not present

## 2023-11-08 DIAGNOSIS — N840 Polyp of corpus uteri: Secondary | ICD-10-CM | POA: Diagnosis not present

## 2023-11-08 DIAGNOSIS — R9389 Abnormal findings on diagnostic imaging of other specified body structures: Secondary | ICD-10-CM

## 2023-11-08 NOTE — Progress Notes (Addendum)
My Chart connect video + audio visit I am in my office Patient is in her home  Total time 10 minutes  Follow up appointment for results: EMBx  Chief Complaint  Patient presents with   Discuss results    There were no vitals taken for this visit.  Endometrial Biopsy: endometrial polyp no hyperplasia or atypia or malignancy  [name] is a 53 y.o. G3P3 No LMP recorded (lmp unknown). Patient is postmenopausal. She is here for a pelvic sonogram for postmenopausal bleeding.   Uterus                      5.4 x 5.1 x 5.6 cm, Total uterine volume 81 cc,heterogeneous anteverted uterus,multiple small myometrial cysts and linear striations (?adenomyosis)   Endometrium          8.2 mm, symmetrical, complex thickened endometrium   Right ovary             3 x 1.2 x 2.5 cm, normal   Left ovary                2.7 x 1.5 x 1.8 cm, simple left ovarian cyst 2.3 x 1.3 x 1.8 cm,multiple small calcification   No free fluid   Technician Comments:   PELVIC US TA/TV: heterogeneous anteverted uterus,multiple small myometrial cysts and linear striations (?adenomyosis),complex thickened endometrium, EEC 8.2 mm,normal right ovary,simple left ovarian cyst 2.3 x 1.3 x 1.8 cm,multiple small calcification left ovary,ovaries appear mobile,no free fluid,no pain during ultrasound    [employer] Flora Lipps 10/30/2023 9:16 AM   Clinical Impression and recommendations:   I have reviewed the sonogram results above, combined with the patient's current clinical course, below are my impressions and any appropriate recommendations for management based on the sonographic findings.   Uterus normal size shape and contour Endometrium thickened for a post menopausal woman, with loculated fluid and complexity, requires sampling Ovaries: small benign cyst left ovary, otherwise normal size shape and morphology   Endometrial sampling is recommended     Lazaro Arms 10/30/2023 2:25 PM   MEDS ordered this  encounter: No orders of the defined types were placed in this encounter.   Orders for this encounter: No orders of the defined types were placed in this encounter.   Impression + Management Plan No diagnosis found.  Follow Up: No follow-ups on file.     All questions were answered.  Past Medical History:  Diagnosis Date   Depression    History of pulmonary embolus (PE)    Hypertension    Plantar fasciitis     Past Surgical History:  Procedure Laterality Date   CESAREAN SECTION     CHOLECYSTECTOMY      OB History     Gravida  3   Para  3   Term      Preterm      AB      Living  3      SAB      IAB      Ectopic      Multiple      Live Births  3           No Known Allergies  Social History   Socioeconomic History   Marital status: Married    Spouse name: Not on file   Number of children: 3   Years of education: Not on file   Highest education level: Not on file  Occupational History  Not on file  Tobacco Use   Smoking status: Never   Smokeless tobacco: Never  Vaping Use   Vaping status: Never Used  Substance and Sexual Activity   Alcohol use: No    Comment: occ.   Drug use: No   Sexual activity: Yes    Birth control/protection: Surgical, Post-menopausal    Comment: husband had vasectomy  Other Topics Concern   Not on file  Social History Narrative   Not on file   Social Drivers of Health   Financial Resource Strain: Low Risk  (04/04/2021)   Overall Financial Resource Strain (CARDIA)    Difficulty of Paying Living Expenses: Not hard at all  Food Insecurity: No Food Insecurity (04/04/2021)   Hunger Vital Sign    Worried About Running Out of Food in the Last Year: Never true    Ran Out of Food in the Last Year: Never true  Transportation Needs: No Transportation Needs (04/04/2021)   PRAPARE - Administrator, Civil Service (Medical): No    Lack of Transportation (Non-Medical): No  Physical Activity: Inactive  (04/04/2021)   Exercise Vital Sign    Days of Exercise per Week: 0 days    Minutes of Exercise per Session: 0 min  Stress: No Stress Concern Present (04/04/2021)   [employer] of Occupational Health - Occupational Stress Questionnaire    Feeling of Stress : Not at all  Social Connections: Socially Integrated (04/04/2021)   Social Connection and Isolation Panel [NHANES]    Frequency of Communication with Friends and Family: Three times a week    Frequency of Social Gatherings with Friends and Family: Twice a week    Attends Religious Services: 1 to 4 times per year    Active Member of [employer] or Organizations: Yes    Attends Engineer, structural: More than 4 times per year    Marital Status: Married    Family History  Problem Relation Age of Onset   Hypertension Father    Cancer Father        throat   Cancer Maternal Grandfather        leukemia   Stroke Maternal Grandfather    Heart disease Paternal Aunt    Heart disease Paternal Uncle

## 2023-11-09 ENCOUNTER — Encounter: Payer: Self-pay | Admitting: Obstetrics & Gynecology

## 2023-11-23 NOTE — Patient Instructions (Addendum)
 Your procedure is scheduled on: 11/28/2023  Report to [facility] Main Entrance at    10:50 AM.  Call this number if you have problems the morning of surgery: [phone]   Remember:   Do not Eat after midnight, You may have CLEAR Liquids until 8:50 am; water, soda, tea, Black coffee NO CREAM         No Smoking the morning of surgery  :  Take these medicines the morning of surgery with A SIP OF WATER: Cymbalta, Omeprazole, and Metoprolol   Do not wear jewelry, make-up or nail polish.  Do not wear lotions, powders, or perfumes. You may wear deodorant.  Do not shave 48 hours prior to surgery. Men may shave face and neck.  Do not bring valuables to the hospital.  Contacts, dentures or bridgework may not be worn into surgery.  Leave suitcase in the car. After surgery it may be brought to your room.  For patients admitted to the hospital, checkout time is 11:00 AM the day of discharge.   Patients discharged the day of surgery will not be allowed to drive home.    Special Instructions: Shower using CHG night before surgery and shower the day of surgery use CHG.  Use special wash - you have one bottle of CHG for all showers.  You should use approximately 1/2 of the bottle for each shower. How to Use Chlorhexidine  at Home in the Shower Chlorhexidine  gluconate (CHG) is a germ-killing (antiseptic) wash that's used to clean the skin. It can get rid of the germs that normally live on the skin and can keep them away for about 24 hours. If you're having surgery, you may be told to shower with CHG at home the night before surgery. This can help lower your risk for infection. To use CHG wash in the shower, follow the steps below. Supplies needed: CHG body wash. Clean washcloth. Clean towel. How to use CHG in the shower Follow these steps unless you're told to use CHG in a different way: Start the shower. Use your normal soap and shampoo to wash your face and hair. Turn off the shower or move out  of the shower stream. Pour CHG onto a clean washcloth. Do not use any type of brush or rough sponge. Start at your neck, washing your body down to your toes. Make sure you: Wash the part of your body where the surgery will be done for at least 1 minute. Do not scrub. Do not use CHG on your head or face unless your health care provider tells you to. If it gets into your ears or eyes, rinse them well with water. Do not wash your genitals with CHG. Wash your back and under your arms. Make sure to wash skin folds. Let the CHG sit on your skin for 1-2 minutes or as long as told. Rinse your entire body in the shower, including all body creases and folds. Turn off the shower. Dry off with a clean towel. Do not put anything on your skin afterward, such as powder, lotion, or perfume. Put on clean clothes or pajamas. If it's the night before surgery, sleep in clean sheets. General tips Use CHG only as told, and follow the instructions on the label. Use the full amount of CHG as told. This is often one bottle. Do not smoke and stay away from flames after using CHG. Your skin may feel sticky after using CHG. This is normal. The sticky feeling will go away as  the CHG dries. Do not use CHG: If you have a chlorhexidine  allergy or have reacted to chlorhexidine  in the past. On open wounds or areas of skin that have broken skin, cuts, or scrapes. On babies younger than 17 months of age. Contact a health care provider if: You have questions about using CHG. Your skin gets irritated or itchy. You have a rash after using CHG. You swallow any CHG. Call your local poison control center [phone] in the [place] Your eyes itch badly, or they become very red or swollen. Your hearing changes. You have trouble seeing. If you can't reach your provider, go to an urgent care or emergency room. Do not drive yourself. Get help right away if: You have swelling or tingling in your mouth or throat. You make  high-pitched whistling sounds when you breathe, most often when you breathe out (wheeze). You have trouble breathing. These symptoms may be an emergency. Call 911 right away. Do not wait to see if the symptoms will go away. Do not drive yourself to the hospital. This information is not intended to replace advice given to you by your health care provider. Make sure you discuss any questions you have with your health care provider. Document Revised: 04/17/2023 Document Reviewed: 04/13/2022 Elsevier Patient Education  2024 Elsevier Inc. Dilation and Curettage, Care After The following information offers guidance on how to care for yourself after your procedure. Your doctor may also give you more specific instructions. If you have problems or questions, contact your doctor. What can I expect after the procedure? After the procedure, it is common to have: Mild pain or cramps. Some bleeding or spotting from the vagina. These may last for up to 2 weeks. Follow these instructions at home: Medicines Take over-the-counter and prescription medicines only as told by your doctor. If told, take steps to prevent problems with pooping (constipation). You may need to: Drink enough fluid to keep your pee (urine) pale yellow. Take medicines. You will be told what medicines to take. Eat foods that are high in fiber. These include beans, whole grains, and fresh fruits and vegetables. Limit foods that are high in fat and sugar. These include fried or sweet foods. Ask your doctor if you should avoid driving or using machines while you are taking your medicine. Activity  If you were given a medicine to help you relax (sedative) during your procedure, it can affect you for many hours. Do not drive or use machinery until your doctor says that it is safe. Rest as told by your doctor. Get up to take short walks every 1-2 hours. Ask for help if you feel weak or unsteady. Do not lift anything that is heavier than 10  lb (4.5 kg), or the limit that you are told. Return to your normal activities when your doctor says that it is safe. Lifestyle For at least 2 weeks, or as long as told by your doctor: Do not douche. Do not use tampons. Do not have sex. General instructions Do not take baths, swim, or use a hot tub. Ask your doctor if you may take showers. Do not smoke or use any products that contain nicotine or tobacco. These can delay healing. If you need help quitting, ask your doctor. Wear compression stockings as told by your doctor. It is up to you to get the results of your procedure. Ask how to get your results when they are ready. Keep all follow-up visits. Contact a doctor if: You have very bad cramps  that get worse or do not get better with medicine. You have very bad pain in your belly (abdomen). You cannot drink fluids without vomiting. You have pain in the area just above your thighs. You have fluid from your vagina that smells bad. You have a rash. Get help right away if: You are bleeding a lot from your vagina. This means soaking more than one sanitary pad in 1 hour, and this happens for 2 hours in a row. You have a fever that is above 100.75F (38C). Your belly feels very tender or hard. You have chest pain. You have trouble breathing. You feel dizzy or light-headed. You faint. You have pain in your neck or shoulder area. These symptoms may be an emergency. Get help right away. Call your local emergency services (911 in the [place] Do not wait to see if the symptoms will go away. Do not drive yourself to the hospital. Summary After your procedure, it is common to have pain or cramping. It is also common to have bleeding or spotting from your vagina. Rest as told. Get up to take short walks every 1-2 hours. Do not lift anything that is heavier than 10 lb (4.5 kg), or the limit that you are told. Get help right away if you have problems from the procedure. Ask your doctor what  problems to watch for. This information is not intended to replace advice given to you by your health care provider. Make sure you discuss any questions you have with your health care provider. Document Revised: 09/20/2020 Document Reviewed: 09/22/2020 Elsevier Patient Education  2024 Elsevier Inc. General Anesthesia, Adult, Care After The following information offers guidance on how to care for yourself after your procedure. Your health care provider may also give you more specific instructions. If you have problems or questions, contact your health care provider. What can I expect after the procedure? After the procedure, it is common for people to: Have pain or discomfort at the IV site. Have nausea or vomiting. Have a sore throat or hoarseness. Have trouble concentrating. Feel cold or chills. Feel weak, sleepy, or tired (fatigue). Have soreness and body aches. These can affect parts of the body that were not involved in surgery. Follow these instructions at home: For the time period you were told by your health care provider:  Rest. Do not participate in activities where you could fall or become injured. Do not drive or use machinery. Do not drink alcohol. Do not take sleeping pills or medicines that cause drowsiness. Do not make important decisions or sign legal documents. Do not take care of children on your own. General instructions Drink enough fluid to keep your urine pale yellow. If you have sleep apnea, surgery and certain medicines can increase your risk for breathing problems. Follow instructions from your health care provider about wearing your sleep device: Anytime you are sleeping, including during daytime naps. While taking prescription pain medicines, sleeping medicines, or medicines that make you drowsy. Return to your normal activities as told by your health care provider. Ask your health care provider what activities are safe for you. Take over-the-counter and  prescription medicines only as told by your health care provider. Do not use any products that contain nicotine or tobacco. These products include cigarettes, chewing tobacco, and vaping devices, such as e-cigarettes. These can delay incision healing after surgery. If you need help quitting, ask your health care provider. Contact a health care provider if: You have nausea or vomiting that does not  get better with medicine. You vomit every time you eat or drink. You have pain that does not get better with medicine. You cannot urinate or have bloody urine. You develop a skin rash. You have a fever. Get help right away if: You have trouble breathing. You have chest pain. You vomit blood. These symptoms may be an emergency. Get help right away. Call 911. Do not wait to see if the symptoms will go away. Do not drive yourself to the hospital. Summary After the procedure, it is common to have a sore throat, hoarseness, nausea, vomiting, or to feel weak, sleepy, or fatigue. For the time period you were told by your health care provider, do not drive or use machinery. Get help right away if you have difficulty breathing, have chest pain, or vomit blood. These symptoms may be an emergency. This information is not intended to replace advice given to you by your health care provider. Make sure you discuss any questions you have with your health care provider. Document Revised: 12/30/2021 Document Reviewed: 12/30/2021 Elsevier Patient Education  2024 Elsevier Inc.  Dilation and Curettage or Vacuum Curettage Dilation and curettage (D&C) and vacuum curettage are minor procedures. A D&C involves stretching the cervix (dilation) and scraping the inside lining of the uterus, or endometrium, with surgical instruments (curettage). During a D&C, tissue is gently scraped starting from the top of the uterus down to the lowest part of the uterus. During a vacuum curettage, the lining and tissue in the uterus are  removed using gentle suction. Curettage may be performed to either diagnose or treat a problem. A diagnostic curettage may be done if you have: Irregular bleeding or clotting from the uterus. Spotting between menstrual periods, prolonged menstrual periods, or other abnormal bleeding. Bleeding after menopause. No menstrual period (amenorrhea). A change in the size and shape of the uterus. Abnormal endometrial cells discovered during a Pap test. For treatment, curettage may be done: To remove an IUD (intrauterine device). To remove the remaining placenta after giving birth. During an abortion or after a miscarriage. To remove growths in the lining of the uterus. To remove certain rare types of noncancerous lumps (fibroids). Tell a health care provider about: Any allergies you have, including allergies to prescribed medicine or latex. All medicines you are taking, including vitamins, herbs, eye drops, creams, and over-the-counter medicines. Any problems you or family members have had with anesthetic medicines. Any blood disorders you have. Any surgeries you have had. Your medical history and any medical conditions you have. Whether you are pregnant or may be pregnant. Recent vaginal infections you have had. Recent menstrual periods, bleeding problems you have had, and what form of birth control (contraception) you use. What are the risks? Generally, this is a safe procedure. However, problems may occur, including: Infection. Heavy vaginal bleeding. Allergic reactions to medicines. Damage to the cervix or nearby structures or organs. Scar tissue developing inside the uterus. This can cause abnormal periods and may make it harder to get pregnant. A hole (perforation) in the wall of the uterus. This is rare. What happens before the procedure? Staying hydrated Follow instructions from your health care provider about hydration, which may include: Up to 2 hours before the procedure - you  may continue to drink clear liquids, such as water, clear fruit juice, black coffee, and plain tea.  Eating and drinking restrictions Follow instructions from your health care provider about eating and drinking, which may include: 8 hours before the procedure - stop eating  heavy meals or foods, such as meat, fried foods, or fatty foods. 6 hours before the procedure - stop eating light meals or foods, such as toast or cereal. 6 hours before the procedure - stop drinking milk or drinks that contain milk. 2 hours before the procedure - stop drinking clear liquids. If your health care provider told you to take your medicine on the day of your procedure, take them with only a sip of water. Medicines Ask your health care provider about: Changing or stopping your regular medicines. This is especially important if you are taking diabetes medicines or blood thinners. Taking medicines such as aspirin and ibuprofen . These medicines can thin your blood. Do not take these medicines unless your health care provider tells you to take them. Taking over-the-counter medicines, vitamins, herbs, and supplements. You may be given a medicine to soften the cervix. This will help with dilation. Tests You may be given a pregnancy test on the day of the procedure. You may have a blood or urine sample taken. General instructions Do not use any products that contain nicotine or tobacco for at least 4 weeks before the procedure. These products include cigarettes, chewing tobacco, and vaping devices, such as e-cigarettes. If you need help quitting, ask your health care provider. For 24 hours before your procedure: Do not douche, use tampons, or have sex. Do not use medicines, creams, or suppositories in the vagina. Ask your health care provider what steps will be taken to help prevent infection. These may include: Removing hair at the procedure site. Washing skin with a germ-killing soap. Taking antibiotic  medicine. Plan to have a responsible adult take you home from the hospital or clinic. If you will be going home right after the procedure, plan to have a responsible adult care for you for the time you are told. This is important. What happens during the procedure?  An IV will be inserted into one of your veins. You will be given one of the following: A medicine that numbs the area in and around the cervix (local anesthetic). A medicine to make you fall asleep (general anesthetic). You will lie down on your back, with your feet in foot rests (stirrups). The size and position of your uterus will be checked. A lubricated instrument (speculum or Sims retractor) will be inserted into your vagina to widen its walls. This will allow your health care provider to see your cervix. Your cervix will be softened and dilated. This may be done by: Taking medicine by mouth or vaginally. Having thin rods or gradual widening instruments inserted into your cervix. A small, sharp, curved instrument (curette) will be used to scrape a small amount of tissue or cells from the endometrium or cervical canal. In some cases, gentle suction is applied with the curette. The cells will be taken to a lab for testing. The procedure may vary among health care providers and hospitals. What happens after the procedure? Your blood pressure, heart rate, breathing rate, and blood oxygen level will be monitored until you leave the hospital or clinic. You may have mild cramping, a backache, pain, and light bleeding or spotting. You may pass small blood clots from your vagina. You may have to wear compression stockings. These stockings help to prevent blood clots and reduce swelling in your legs. It is up to you to get the results of your procedure. Ask your health care provider, or the department that is doing the procedure, when your results will be ready. Summary Dilation  and curettage (D&C) involves stretching (dilating) the  cervix and scraping the inside lining of the uterus with surgical instruments (curettage). Follow your health care provider's instructions about when to stop eating and drinking before the procedure, and whether to stop or change any medicines. After the procedure, you may have mild cramping, a backache, pain, and light bleeding or spotting. You may pass small blood clots from your vagina. Plan to have a responsible adult take you home from the hospital or clinic. This information is not intended to replace advice given to you by your health care provider. Make sure you discuss any questions you have with your health care provider. Document Revised: 09/20/2020 Document Reviewed: 09/22/2020 Elsevier Patient Education  2024 [employer].

## 2023-11-25 ENCOUNTER — Other Ambulatory Visit: Payer: Self-pay | Admitting: Obstetrics & Gynecology

## 2023-11-25 DIAGNOSIS — Z01818 Encounter for other preprocedural examination: Secondary | ICD-10-CM

## 2023-11-26 ENCOUNTER — Encounter (HOSPITAL_COMMUNITY)
Admission: RE | Admit: 2023-11-26 | Discharge: 2023-11-26 | Disposition: A | Discharge status: Home / Self Care | Payer: 59 | Source: Ambulatory Visit | Attending: Obstetrics & Gynecology | Admitting: Obstetrics & Gynecology

## 2023-11-26 ENCOUNTER — Encounter (HOSPITAL_COMMUNITY): Payer: Self-pay

## 2023-11-26 VITALS — BP 126/88 | HR 80 | Temp 98.0°F | Resp 18 | Ht 66.0 in | Wt 217.0 lb

## 2023-11-26 DIAGNOSIS — E876 Hypokalemia: Secondary | ICD-10-CM | POA: Insufficient documentation

## 2023-11-26 DIAGNOSIS — Z01812 Encounter for preprocedural laboratory examination: Secondary | ICD-10-CM | POA: Diagnosis present

## 2023-11-26 DIAGNOSIS — T502X5A Adverse effect of carbonic-anhydrase inhibitors, benzothiadiazides and other diuretics, initial encounter: Secondary | ICD-10-CM | POA: Insufficient documentation

## 2023-11-26 DIAGNOSIS — Z01810 Encounter for preprocedural cardiovascular examination: Secondary | ICD-10-CM | POA: Diagnosis present

## 2023-11-26 DIAGNOSIS — I10 Essential (primary) hypertension: Secondary | ICD-10-CM | POA: Diagnosis not present

## 2023-11-26 DIAGNOSIS — Z01818 Encounter for other preprocedural examination: Secondary | ICD-10-CM | POA: Diagnosis not present

## 2023-11-26 HISTORY — DX: Sleep apnea, unspecified: G47.30

## 2023-11-26 LAB — BASIC METABOLIC PANEL
Anion gap: 10 (ref 5–15)
BUN: 15 mg/dL (ref 6–20)
CO2: 24 mmol/L (ref 22–32)
Calcium: 9.7 mg/dL (ref 8.9–10.3)
Chloride: 104 mmol/L (ref 98–111)
Creatinine, Ser: 0.84 mg/dL (ref 0.44–1.00)
GFR, Estimated: >60 mL/min (ref >60)
Glucose, Bld: 98 mg/dL (ref 70–99)
Potassium: 3.9 mmol/L (ref 3.5–5.1)
Sodium: 138 mmol/L (ref 135–145)

## 2023-11-26 LAB — CBC
HCT: 45.5 % (ref 36.0–46.0)
Hemoglobin: 15.2 g/dL — ABNORMAL HIGH (ref 12.0–15.0)
MCH: 29.9 pg (ref 26.0–34.0)
MCHC: 33.4 g/dL (ref 30.0–36.0)
MCV: 89.6 fL (ref 80.0–100.0)
Platelets: 287 10*3/uL (ref 150–400)
RBC: 5.08 MIL/uL (ref 3.87–5.11)
RDW: 12.3 % (ref 11.5–15.5)
WBC: 9.8 10*3/uL (ref 4.0–10.5)
nRBC: 0 % (ref 0.0–0.2)

## 2023-11-26 LAB — URINALYSIS, ROUTINE W REFLEX MICROSCOPIC
Bacteria, UA: NONE SEEN
Bilirubin Urine: NEGATIVE
Glucose, UA: NEGATIVE mg/dL
Ketones, ur: NEGATIVE mg/dL
Leukocytes,Ua: NEGATIVE
Nitrite: NEGATIVE
Protein, ur: NEGATIVE mg/dL
Specific Gravity, Urine: 1.012 (ref 1.005–1.030)
pH: 6 (ref 5.0–8.0)

## 2023-11-26 LAB — TYPE AND SCREEN
ABO/RH(D): AB NEG
Antibody Screen: NEGATIVE

## 2023-11-26 LAB — RAPID HIV SCREEN (HIV 1/2 AB+AG)
HIV 1/2 Antibodies: NONREACTIVE
HIV-1 P24 Antigen - HIV24: NONREACTIVE

## 2023-11-26 LAB — PREGNANCY, URINE: Preg Test, Ur: NEGATIVE

## 2023-11-28 ENCOUNTER — Encounter (HOSPITAL_COMMUNITY)
Admission: RE | Disposition: A | Discharge status: Home / Self Care | Payer: Self-pay | Source: Home / Self Care | Attending: Obstetrics & Gynecology

## 2023-11-28 ENCOUNTER — Ambulatory Visit (HOSPITAL_COMMUNITY): Payer: Self-pay | Admitting: Anesthesiology

## 2023-11-28 ENCOUNTER — Ambulatory Visit (HOSPITAL_COMMUNITY)
Admission: RE | Admit: 2023-11-28 | Discharge: 2023-11-28 | Disposition: A | Discharge status: Home / Self Care | Payer: 59 | Attending: Obstetrics & Gynecology | Admitting: Obstetrics & Gynecology

## 2023-11-28 ENCOUNTER — Ambulatory Visit (HOSPITAL_BASED_OUTPATIENT_CLINIC_OR_DEPARTMENT_OTHER): Payer: Self-pay | Admitting: Anesthesiology

## 2023-11-28 DIAGNOSIS — N840 Polyp of corpus uteri: Secondary | ICD-10-CM | POA: Insufficient documentation

## 2023-11-28 DIAGNOSIS — N950 Postmenopausal bleeding: Secondary | ICD-10-CM | POA: Diagnosis present

## 2023-11-28 DIAGNOSIS — R9389 Abnormal findings on diagnostic imaging of other specified body structures: Secondary | ICD-10-CM | POA: Diagnosis not present

## 2023-11-28 DIAGNOSIS — I10 Essential (primary) hypertension: Secondary | ICD-10-CM | POA: Diagnosis not present

## 2023-11-28 HISTORY — PX: DILATION AND CURETTAGE OF UTERUS: SHX78

## 2023-11-28 SURGERY — DILATION AND CURETTAGE
Anesthesia: General | Site: Vagina

## 2023-11-28 MED ORDER — MEPERIDINE HCL 50 MG/ML IJ SOLN: 6.2500 mg | INTRAMUSCULAR | Status: DC | PRN

## 2023-11-28 MED ORDER — PHENYLEPHRINE 80 MCG/ML (10ML) SYRINGE FOR IV PUSH (FOR BLOOD PRESSURE SUPPORT)
PREFILLED_SYRINGE | INTRAVENOUS | Status: AC
  Filled 2023-11-28: qty 10

## 2023-11-28 MED ORDER — METOCLOPRAMIDE HCL 5 MG/ML IJ SOLN: 10.0000 mg | Freq: Once | INTRAMUSCULAR | Status: DC | PRN

## 2023-11-28 MED ORDER — ONDANSETRON HCL 4 MG/2ML IJ SOLN
INTRAMUSCULAR | Status: DC | PRN
  Administered 2023-11-28: 4 mg via INTRAVENOUS

## 2023-11-28 MED ORDER — PROPOFOL 10 MG/ML IV BOLUS
INTRAVENOUS | Status: AC
  Filled 2023-11-28: qty 20

## 2023-11-28 MED ORDER — SCOPOLAMINE 1 MG/3DAYS TD PT72
MEDICATED_PATCH | TRANSDERMAL | Status: AC
Start: 2023-11-28 — End: 2023-11-28
  Filled 2023-11-28: qty 1

## 2023-11-28 MED ORDER — LACTATED RINGERS IV SOLN: INTRAVENOUS | Status: DC | PRN

## 2023-11-28 MED ORDER — FENTANYL CITRATE (PF) 100 MCG/2ML IJ SOLN
INTRAMUSCULAR | Status: DC | PRN
  Administered 2023-11-28 (×4): 25 ug via INTRAVENOUS

## 2023-11-28 MED ORDER — SODIUM CHLORIDE 0.9 % IR SOLN
Status: DC | PRN
  Administered 2023-11-28: 3000 mL

## 2023-11-28 MED ORDER — PROPOFOL 10 MG/ML IV BOLUS
INTRAVENOUS | Status: DC | PRN
  Administered 2023-11-28: 200 mg via INTRAVENOUS

## 2023-11-28 MED ORDER — ORAL CARE MOUTH RINSE: 15.0000 mL | Freq: Once | OROMUCOSAL | Status: AC

## 2023-11-28 MED ORDER — BUPIVACAINE HCL (PF) 0.5 % IJ SOLN
INTRAMUSCULAR | Status: AC
  Filled 2023-11-28: qty 30

## 2023-11-28 MED ORDER — DEXAMETHASONE SODIUM PHOSPHATE 10 MG/ML IJ SOLN
INTRAMUSCULAR | Status: AC
  Filled 2023-11-28: qty 1

## 2023-11-28 MED ORDER — 0.9 % SODIUM CHLORIDE (POUR BTL) OPTIME
TOPICAL | Status: DC | PRN
Start: 2023-11-28 — End: 2023-11-28
  Administered 2023-11-28: 1000 mL

## 2023-11-28 MED ORDER — BUPIVACAINE HCL (PF) 0.5 % IJ SOLN
INTRAMUSCULAR | Status: DC | PRN
  Administered 2023-11-28: 20 mL

## 2023-11-28 MED ORDER — ONDANSETRON 8 MG PO TBDP: 8.0000 mg | ORAL_TABLET | Freq: Three times a day (TID) | ORAL | 0 refills | Status: AC | PRN

## 2023-11-28 MED ORDER — GLYCOPYRROLATE PF 0.2 MG/ML IJ SOSY
PREFILLED_SYRINGE | INTRAMUSCULAR | Status: AC
  Filled 2023-11-28: qty 1

## 2023-11-28 MED ORDER — CHLORHEXIDINE GLUCONATE 0.12 % MT SOLN
15.0000 mL | Freq: Once | OROMUCOSAL | Status: AC
  Administered 2023-11-28: 15 mL via OROMUCOSAL

## 2023-11-28 MED ORDER — KETOROLAC TROMETHAMINE 30 MG/ML IJ SOLN
INTRAMUSCULAR | Status: AC
  Filled 2023-11-28: qty 1

## 2023-11-28 MED ORDER — MIDAZOLAM HCL 2 MG/2ML IJ SOLN
INTRAMUSCULAR | Status: DC | PRN
  Administered 2023-11-28: 2 mg via INTRAVENOUS

## 2023-11-28 MED ORDER — FENTANYL CITRATE PF 50 MCG/ML IJ SOSY: 25.0000 ug | PREFILLED_SYRINGE | INTRAMUSCULAR | Status: DC | PRN

## 2023-11-28 MED ORDER — GLYCOPYRROLATE PF 0.2 MG/ML IJ SOSY
PREFILLED_SYRINGE | INTRAMUSCULAR | Status: DC | PRN
  Administered 2023-11-28 (×2): .1 mg via INTRAVENOUS

## 2023-11-28 MED ORDER — SCOPOLAMINE 1 MG/3DAYS TD PT72
MEDICATED_PATCH | TRANSDERMAL | Status: DC | PRN
  Administered 2023-11-28: 1 via TRANSDERMAL

## 2023-11-28 MED ORDER — MIDAZOLAM HCL 2 MG/2ML IJ SOLN
INTRAMUSCULAR | Status: AC
  Filled 2023-11-28: qty 2

## 2023-11-28 MED ORDER — DEXAMETHASONE SODIUM PHOSPHATE 10 MG/ML IJ SOLN
INTRAMUSCULAR | Status: DC | PRN
  Administered 2023-11-28: 5 mg via INTRAVENOUS

## 2023-11-28 MED ORDER — LACTATED RINGERS IV SOLN: INTRAVENOUS | Status: DC

## 2023-11-28 MED ORDER — DEXMEDETOMIDINE HCL IN NACL 80 MCG/20ML IV SOLN
INTRAVENOUS | Status: DC | PRN
  Administered 2023-11-28: 8 ug via INTRAVENOUS

## 2023-11-28 MED ORDER — LIDOCAINE HCL (PF) 2 % IJ SOLN
INTRAMUSCULAR | Status: DC | PRN
  Administered 2023-11-28: 100 mg via INTRADERMAL

## 2023-11-28 MED ORDER — KETOROLAC TROMETHAMINE 30 MG/ML IJ SOLN
30.0000 mg | Freq: Once | INTRAMUSCULAR | Status: AC
  Administered 2023-11-28: 30 mg via INTRAVENOUS

## 2023-11-28 MED ORDER — PHENYLEPHRINE 80 MCG/ML (10ML) SYRINGE FOR IV PUSH (FOR BLOOD PRESSURE SUPPORT)
PREFILLED_SYRINGE | INTRAVENOUS | Status: DC | PRN
Start: 2023-11-28 — End: 2023-11-28
  Administered 2023-11-28: 80 ug via INTRAVENOUS
  Administered 2023-11-28: 160 ug via INTRAVENOUS

## 2023-11-28 MED ORDER — ONDANSETRON HCL 4 MG/2ML IJ SOLN
INTRAMUSCULAR | Status: AC
  Filled 2023-11-28: qty 2

## 2023-11-28 MED ORDER — CEFAZOLIN SODIUM-DEXTROSE 2-4 GM/100ML-% IV SOLN
INTRAVENOUS | Status: AC
  Filled 2023-11-28: qty 100

## 2023-11-28 MED ORDER — LIDOCAINE HCL (PF) 2 % IJ SOLN
INTRAMUSCULAR | Status: AC
  Filled 2023-11-28: qty 5

## 2023-11-28 MED ORDER — POVIDONE-IODINE 10 % EX SWAB
2.0000 | Freq: Once | CUTANEOUS | Status: AC
  Administered 2023-11-28: 2 via TOPICAL

## 2023-11-28 MED ORDER — FENTANYL CITRATE (PF) 100 MCG/2ML IJ SOLN
INTRAMUSCULAR | Status: AC
  Filled 2023-11-28: qty 2

## 2023-11-28 MED ORDER — CEFAZOLIN SODIUM-DEXTROSE 2-4 GM/100ML-% IV SOLN
2.0000 g | INTRAVENOUS | Status: AC
  Administered 2023-11-28: 2 g via INTRAVENOUS

## 2023-11-28 MED ORDER — KETOROLAC TROMETHAMINE 10 MG PO TABS: 10.0000 mg | ORAL_TABLET | Freq: Three times a day (TID) | ORAL | 0 refills | Status: AC | PRN

## 2023-11-28 SURGICAL SUPPLY — 26 items
BAG HAMPER (MISCELLANEOUS) ×1 IMPLANT
CLOTH BEACON ORANGE TIMEOUT ST (SAFETY) ×1 IMPLANT
COVER LIGHT HANDLE STERIS (MISCELLANEOUS) ×2 IMPLANT
DRSG TELFA 3X8 NADH STRL (GAUZE/BANDAGES/DRESSINGS) IMPLANT
GAUZE 4X4 16PLY ~~LOC~~+RFID DBL (SPONGE) ×2 IMPLANT
GLOVE BIOGEL PI IND STRL 7.0 (GLOVE) ×2 IMPLANT
GLOVE BIOGEL PI IND STRL 8 (GLOVE) ×1 IMPLANT
GLOVE ECLIPSE 8.0 STRL XLNG CF (GLOVE) ×2 IMPLANT
GOWN STRL REUS W/TWL LRG LVL3 (GOWN DISPOSABLE) ×1 IMPLANT
GOWN STRL REUS W/TWL XL LVL3 (GOWN DISPOSABLE) ×1 IMPLANT
INST SET HYSTEROSCOPY (KITS) ×1 IMPLANT
KIT TURNOVER CYSTO (KITS) ×1 IMPLANT
MANIFOLD NEPTUNE II (INSTRUMENTS) ×1 IMPLANT
NDL SPNL 22GX3.5 QUINCKE BK (NEEDLE) IMPLANT
NEEDLE SPNL 22GX3.5 QUINCKE BK (NEEDLE) ×1 IMPLANT
NS IRRIG 1000ML POUR BTL (IV SOLUTION) ×1 IMPLANT
PACK SRG BSC III STRL LF ECLPS (CUSTOM PROCEDURE TRAY) ×1 IMPLANT
PAD ARMBOARD 7.5X6 YLW CONV (MISCELLANEOUS) ×1 IMPLANT
POSITIONER HEAD 8X9X4 ADT (SOFTGOODS) ×1 IMPLANT
SET BASIN LINEN APH (SET/KITS/TRAYS/PACK) ×1 IMPLANT
SET CYSTO W/LG BORE CLAMP LF (SET/KITS/TRAYS/PACK) ×1 IMPLANT
SHEET LAVH (DRAPES) ×1 IMPLANT
SURGILUBE 2OZ TUBE FLIPTOP (MISCELLANEOUS) IMPLANT
SYR 20ML LL LF (SYRINGE) IMPLANT
TOWEL OR 17X26 4PK STRL BLUE (TOWEL DISPOSABLE) IMPLANT
TUBE CONNECTING 12X1/4 (SUCTIONS) ×1 IMPLANT

## 2023-11-28 NOTE — Anesthesia Postprocedure Evaluation (Signed)
Anesthesia Post Note  Patient: Cullen Vanallen  Procedure(s) Performed: DILATATION AND CURETTAGE/ HYSTEROSCOPY (Vagina )  Patient location during evaluation: PACU Anesthesia Type: General Level of consciousness: awake and alert Pain management: pain level controlled Vital Signs Assessment: post-procedure vital signs reviewed and stable Respiratory status: spontaneous breathing, nonlabored ventilation and respiratory function stable Cardiovascular status: blood pressure returned to baseline and stable Postop Assessment: no apparent nausea or vomiting Anesthetic complications: no   No notable events documented.   Last Vitals:  Vitals:   11/28/23 1015 11/28/23 1025  BP: 110/75 119/79  Pulse: 73 75  Resp: 14   Temp:  36.4 C  SpO2: 95% 98%    Last Pain:  Vitals:   11/28/23 1025  TempSrc: Oral  PainSc: 2                  Roslynn Amble

## 2023-11-28 NOTE — Anesthesia Procedure Notes (Signed)
Procedure Name: LMA Insertion Date/Time: 11/28/2023 8:40 AM  Performed by: Julian Reil, CRNAPre-anesthesia Checklist: Patient identified, Emergency Drugs available, Suction available and Patient being monitored Patient Re-evaluated:Patient Re-evaluated prior to induction Oxygen Delivery Method: Circle system utilized Preoxygenation: Pre-oxygenation with 100% oxygen Induction Type: IV induction Ventilation: Mask ventilation without difficulty LMA: LMA inserted LMA Size: 4.0 Tube type: Oral Number of attempts: 1 Placement Confirmation: positive ETCO2 Tube secured with: Tape Dental Injury: Teeth and Oropharynx as per pre-operative assessment

## 2023-11-28 NOTE — Anesthesia Preprocedure Evaluation (Signed)
Anesthesia Evaluation  Patient identified by MRN, date of birth, ID band Patient awake    Reviewed: Allergy & Precautions, H&P , NPO status , Patient's Chart, lab work & pertinent test results  Airway Mallampati: II  TM Distance: >3 FB Neck ROM: Full    Dental no notable dental hx.    Pulmonary neg pulmonary ROS   Pulmonary exam normal breath sounds clear to auscultation       Cardiovascular hypertension, negative cardio ROS Normal cardiovascular exam Rhythm:Regular Rate:Normal     Neuro/Psych  PSYCHIATRIC DISORDERS  Depression    negative neurological ROS     GI/Hepatic Neg liver ROS,GERD  ,,  Endo/Other  negative endocrine ROS    Renal/GU negative Renal ROS  negative genitourinary   Musculoskeletal negative musculoskeletal ROS (+)    Abdominal  (+) + obese  Peds negative pediatric ROS (+)  Hematology negative hematology ROS (+)   Anesthesia Other Findings   Reproductive/Obstetrics negative OB ROS                             Anesthesia Physical Anesthesia Plan  ASA: 2  Anesthesia Plan: General   Post-op Pain Management:    Induction: Intravenous  PONV Risk Score and Plan: 1  Airway Management Planned: LMA  Additional Equipment:   Intra-op Plan:   Post-operative Plan: Extubation in OR  Informed Consent: I have reviewed the patients History and Physical, chart, labs and discussed the procedure including the risks, benefits and alternatives for the proposed anesthesia with the patient or authorized representative who has indicated his/her understanding and acceptance.     Dental advisory given  Plan Discussed with: CRNA  Anesthesia Plan Comments:        Anesthesia Quick Evaluation

## 2023-11-28 NOTE — Op Note (Addendum)
Preoperative diagnosis: Postmenopausal bleeding                                         Thickened endometrium, benign pathology in office, complex on sonogram   Postoperative diagnosis: Endometrial polyp  Procedure: Operative hysteroscopy with removal of polyp, endometrial curettage                     Paracervical block for the purpose of post op pain control, placed by me  Surgeon: Lazaro Arms   Anesthesia: Laryngeal mask airway   Findings: the patient had a complex endometrium on sonogram thickened, normal office pathology but I needed more thorough evaluation to definitively rule out more significant pathology   On hysteroscopy she had an endometrial polyp that appeared benign and inactive appearing endometrium  Description of operation: Patient was taken to the operating room and placed in the supine position where she underwent laryngeal mask airway anesthesia. She was placed in the dorsal lithotomy position.  She was prepped and draped in the usual sterile fashion.  A Graves speculum was placed.  The cervix was grasped with a single-tooth tenaculum.  The cervix was dilated serially using Hegar dilators to allow passage of the hysteroscope.  The hysteroscope was then placed into the endometrial cavity without difficulty and the above noted findings were seen.  #1 curette was used to remove the endometrial polyp and perform the uterine curettage The endometrium was thoroughly curetted with good uterine cry in all areas  Hysteroscopic reevaluation confirmed the removal of all endometrial pathology  There was good hemostasis with only suspected minor endometrial using  The patient tolerated the procedure well  She experienced minimal blood loss   20 cc total of 0.5% Marcain plain was placed by me as a paracervical block for the purpose of post operative pain management  She was taken to the recovery room in good stable condition  All counts were correct x3  She received 2 g of  Ancef and 30 mg of Toradol preoperatively  She will be followed up in the office next week for postoperative visit and to review the pathology which I expect to be benign  Lazaro Arms, MD 11/28/2023 9:35 AM

## 2023-11-28 NOTE — Transfer of Care (Signed)
Immediate Anesthesia Transfer of Care Note  Patient: [name]  Procedure(s) Performed: DILATATION AND CURETTAGE/ HYSTEROSCOPY (Vagina )  Patient Location: PACU  Anesthesia Type:General  Level of Consciousness: drowsy  Airway & Oxygen Therapy: Patient Spontanous Breathing and Patient connected to face mask oxygen  Post-op Assessment: Report given to RN and Post -op Vital signs reviewed and stable  Post vital signs: Reviewed and stable  Last Vitals:  Vitals Value Taken Time  BP    Temp    Pulse    Resp    SpO2      Last Pain:  Vitals:   11/28/23 0722  PainSc: 0-No pain         Complications: No notable events documented.

## 2023-11-28 NOTE — H&P (Signed)
Preoperative History and Physical  [name] is a 53 y.o. G3P3 with No LMP recorded (lmp unknown). Patient is postmenopausal. admitted for a hysteroscopy D&C.   EMBx was benign but her endometrium is thickened and complex and needs complete evaluation with hysteroscopy and uterine curettage  PMH:    Past Medical History:  Diagnosis Date   Depression    History of pulmonary embolus (PE)    Hypertension    Plantar fasciitis    Sleep apnea     PSH:     Past Surgical History:  Procedure Laterality Date   CESAREAN SECTION     CHOLECYSTECTOMY      POb/GynH:      OB History     Gravida  3   Para  3   Term      Preterm      AB      Living  3      SAB      IAB      Ectopic      Multiple      Live Births  3           SH:   Social History   Tobacco Use   Smoking status: Never   Smokeless tobacco: Never  Vaping Use   Vaping status: Never Used  Substance Use Topics   Alcohol use: No    Comment: occ.   Drug use: No    FH:    Family History  Problem Relation Age of Onset   Hypertension Father    Cancer Father        throat   Cancer Maternal Grandfather        leukemia   Stroke Maternal Grandfather    Heart disease Paternal Aunt    Heart disease Paternal Uncle      Allergies: No Known Allergies  Medications:       Current Facility-Administered Medications:    ceFAZolin (ANCEF) 2-4 GM/100ML-% IVPB, , , ,    ceFAZolin (ANCEF) IVPB 2g/100 mL premix, 2 g, Intravenous, On Call to OR, Lazaro Arms, MD   ketorolac (TORADOL) 30 MG/ML injection, , , ,   Review of Systems:   Review of Systems  Constitutional: Negative for fever, chills, weight loss, malaise/fatigue and diaphoresis.  HENT: Negative for hearing loss, ear pain, nosebleeds, congestion, sore throat, neck pain, tinnitus and ear discharge.   Eyes: Negative for blurred vision, double vision, photophobia, pain, discharge and redness.  Respiratory: Negative for cough, hemoptysis,  sputum production, shortness of breath, wheezing and stridor.   Cardiovascular: Negative for chest pain, palpitations, orthopnea, claudication, leg swelling and PND.  Gastrointestinal: Positive for abdominal pain. Negative for heartburn, nausea, vomiting, diarrhea, constipation, blood in stool and melena.  Genitourinary: Negative for dysuria, urgency, frequency, hematuria and flank pain.  Musculoskeletal: Negative for myalgias, back pain, joint pain and falls.  Skin: Negative for itching and rash.  Neurological: Negative for dizziness, tingling, tremors, sensory change, speech change, focal weakness, seizures, loss of consciousness, weakness and headaches.  Endo/Heme/Allergies: Negative for environmental allergies and polydipsia. Does not bruise/bleed easily.  Psychiatric/Behavioral: Negative for depression, suicidal ideas, hallucinations, memory loss and substance abuse. The patient is not nervous/anxious and does not have insomnia.      PHYSICAL EXAM:  Blood pressure 122/79, pulse 86, temperature 98.6 F (37 C), resp. rate 16, SpO2 96%.    Vitals reviewed. Constitutional: She is oriented to person, place, and time. She appears well-developed and well-nourished.  HENT:  Head: Normocephalic and atraumatic.  Right Ear: External ear normal.  Left Ear: External ear normal.  Nose: Nose normal.  Mouth/Throat: Oropharynx is clear and moist.  Eyes: Conjunctivae and EOM are normal. Pupils are equal, round, and reactive to light. Right eye exhibits no discharge. Left eye exhibits no discharge. No scleral icterus.  Neck: Normal range of motion. Neck supple. No tracheal deviation present. No thyromegaly present.  Cardiovascular: Normal rate, regular rhythm, normal heart sounds and intact distal pulses.  Exam reveals no gallop and no friction rub.   No murmur heard. Respiratory: Effort normal and breath sounds normal. No respiratory distress. She has no wheezes. She has no rales. She exhibits no  tenderness.  GI: Soft. Bowel sounds are normal. She exhibits no distension and no mass. There is tenderness. There is no rebound and no guarding.  Genitourinary:       Vulva is normal without lesions Vagina is pink moist without discharge Cervix normal in appearance and pap is normal Uterus is normal size, contour, position, consistency, mobility, non-tender Adnexa is negative with normal sized ovaries by sonogram  Musculoskeletal: Normal range of motion. She exhibits no edema and no tenderness.  Neurological: She is alert and oriented to person, place, and time. She has normal reflexes. She displays normal reflexes. No cranial nerve deficit. She exhibits normal muscle tone. Coordination normal.  Skin: Skin is warm and dry. No rash noted. No erythema. No pallor.  Psychiatric: She has a normal mood and affect. Her behavior is normal. Judgment and thought content normal.    Labs: Results for orders placed or performed during the hospital encounter of 11/26/23 (from the past 2 weeks)  CBC   Collection Time: 11/26/23  2:42 PM  Result Value Ref Range   WBC 9.8 4.0 - 10.5 K/uL   RBC 5.08 3.87 - 5.11 MIL/uL   Hemoglobin 15.2 (H) 12.0 - 15.0 g/dL   HCT 19.1 47.8 - 29.5 %   MCV 89.6 80.0 - 100.0 fL   MCH 29.9 26.0 - 34.0 pg   MCHC 33.4 30.0 - 36.0 g/dL   RDW 62.1 30.8 - 65.7 %   Platelets 287 150 - 400 K/uL   nRBC 0.0 0.0 - 0.2 %  Rapid HIV screen (HIV 1/2 Ab+Ag)   Collection Time: 11/26/23  2:42 PM  Result Value Ref Range   HIV-1 P24 Antigen - HIV24 NON REACTIVE NON REACTIVE   HIV 1/2 Antibodies NON REACTIVE NON REACTIVE   Interpretation (HIV Ag Ab)      A non reactive test result means that HIV 1 or HIV 2 antibodies and HIV 1 p24 antigen were not detected in the specimen.  Urinalysis, Routine w reflex microscopic -Urine, Clean Catch   Collection Time: 11/26/23  2:42 PM  Result Value Ref Range   Color, Urine YELLOW YELLOW   APPearance CLEAR CLEAR   Specific Gravity, Urine 1.012 1.005  - 1.030   pH 6.0 5.0 - 8.0   Glucose, UA NEGATIVE NEGATIVE mg/dL   Hgb urine dipstick MODERATE (A) NEGATIVE   Bilirubin Urine NEGATIVE NEGATIVE   Ketones, ur NEGATIVE NEGATIVE mg/dL   Protein, ur NEGATIVE NEGATIVE mg/dL   Nitrite NEGATIVE NEGATIVE   Leukocytes,Ua NEGATIVE NEGATIVE   RBC / HPF 0-5 0 - 5 RBC/hpf   WBC, UA 0-5 0 - 5 WBC/hpf   Bacteria, UA NONE SEEN NONE SEEN   Squamous Epithelial / HPF 0-5 0 - 5 /HPF  Pregnancy, urine   Collection Time: 11/26/23  2:42  PM  Result Value Ref Range   Preg Test, Ur NEGATIVE NEGATIVE  Basic metabolic panel   Collection Time: 11/26/23  2:42 PM  Result Value Ref Range   Sodium 138 135 - 145 mmol/L   Potassium 3.9 3.5 - 5.1 mmol/L   Chloride 104 98 - 111 mmol/L   CO2 24 22 - 32 mmol/L   Glucose, Bld 98 70 - 99 mg/dL   BUN 15 6 - 20 mg/dL   Creatinine, Ser 1.61 0.44 - 1.00 mg/dL   Calcium 9.7 8.9 - 09.6 mg/dL   GFR, Estimated >04 >54 mL/min   Anion gap 10 5 - 15  Type and screen   Collection Time: 11/26/23  2:42 PM  Result Value Ref Range   ABO/RH(D) AB NEG    Antibody Screen NEG    Sample Expiration 12/10/2023,2359    Extend sample reason      NO TRANSFUSIONS OR PREGNANCY IN THE PAST 3 MONTHS Performed at [facility], [address] [place], [place] [address]     EKG: Orders placed or performed during the hospital encounter of 11/26/23   EKG 12-LEAD   EKG 12-LEAD    Imaging Studies: US PELVIC COMPLETE WITH TRANSVAGINAL Result Date: 10/30/2023 Images from the original result were not included.  ..an [employer] [facility] Technical sales engineer) accredited practice Center for [facility] [address] [place] [address] Ordering Provider: Adline Potter, NP                                                                                                                                   GYNECOLOGIC SONOGRAM [name] is a 53 y.o. G3P3 No LMP recorded (lmp unknown). Patient is postmenopausal.  She is here for a pelvic sonogram for postmenopausal bleeding. Uterus                      5.4 x 5.1 x 5.6 cm, Total uterine volume 81 cc,heterogeneous anteverted uterus,multiple small myometrial cysts and linear striations (?adenomyosis) Endometrium          8.2 mm, symmetrical, complex thickened endometrium Right ovary             3 x 1.2 x 2.5 cm, normal Left ovary                2.7 x 1.5 x 1.8 cm, simple left ovarian cyst 2.3 x 1.3 x 1.8 cm,multiple small calcification No free fluid Technician Comments: PELVIC US TA/TV: heterogeneous anteverted uterus,multiple small myometrial cysts and linear striations (?adenomyosis),complex thickened endometrium, EEC 8.2 mm,normal right ovary,simple left ovarian cyst 2.3 x 1.3 x 1.8 cm,multiple small calcification left ovary,ovaries appear mobile,no free fluid,no pain during ultrasound [employer] Flora Lipps 10/30/2023 9:16 AM Clinical Impression and recommendations: I have reviewed the sonogram results above, combined with the patient's current clinical course, below are my impressions and any appropriate  recommendations for management based on the sonographic findings. Uterus normal size shape and contour Endometrium thickened for a post menopausal woman, with loculated fluid and complexity, requires sampling Ovaries: small benign cyst left ovary, otherwise normal size shape and morphology Endometrial sampling is recommended Lazaro Arms 10/30/2023 2:25 PM       Assessment: Post menopausal bleeding: office endometrial biopsy inactive endometrium Thickened and Complex endometrium on sonogram  Plan: Hysteroscopy D&C  Lazaro Arms 11/28/2023 7:54 AM

## 2023-11-29 ENCOUNTER — Encounter (HOSPITAL_COMMUNITY): Payer: Self-pay | Admitting: Obstetrics & Gynecology

## 2023-11-29 LAB — SURGICAL PATHOLOGY
